# Patient Record
Sex: Male | Born: 1985 | Race: White | Hispanic: No | Marital: Single | Smoking: Never smoker
Health system: Southern US, Community
[De-identification: ages and names within clinical notes are randomized; demographics above are authoritative.]

## PROBLEM LIST (undated history)

## (undated) DIAGNOSIS — S069X9A Unspecified intracranial injury with loss of consciousness of unspecified duration, initial encounter: Secondary | ICD-10-CM

## (undated) DIAGNOSIS — F4310 Post-traumatic stress disorder, unspecified: Secondary | ICD-10-CM

## (undated) DIAGNOSIS — E079 Disorder of thyroid, unspecified: Secondary | ICD-10-CM

## (undated) DIAGNOSIS — E0590 Thyrotoxicosis, unspecified without thyrotoxic crisis or storm: Secondary | ICD-10-CM

## (undated) DIAGNOSIS — S069XAA Unspecified intracranial injury with loss of consciousness status unknown, initial encounter: Secondary | ICD-10-CM

## (undated) DIAGNOSIS — G35 Multiple sclerosis: Secondary | ICD-10-CM

## (undated) HISTORY — PX: FRACTURE SURGERY: SHX138

---
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
## 2012-08-03 ENCOUNTER — Emergency Department (HOSPITAL_BASED_OUTPATIENT_CLINIC_OR_DEPARTMENT_OTHER)

## 2012-08-03 ENCOUNTER — Emergency Department (HOSPITAL_BASED_OUTPATIENT_CLINIC_OR_DEPARTMENT_OTHER)
Admission: EM | Admit: 2012-08-03 | Discharge: 2012-08-03 | Disposition: A | Discharge status: Home / Self Care | Attending: Emergency Medicine | Admitting: Emergency Medicine

## 2012-08-03 ENCOUNTER — Encounter (HOSPITAL_BASED_OUTPATIENT_CLINIC_OR_DEPARTMENT_OTHER): Payer: Self-pay | Admitting: *Deleted

## 2012-08-03 DIAGNOSIS — S9002XA Contusion of left ankle, initial encounter: Secondary | ICD-10-CM

## 2012-08-03 DIAGNOSIS — Y939 Activity, unspecified: Secondary | ICD-10-CM | POA: Insufficient documentation

## 2012-08-03 DIAGNOSIS — W208XXA Other cause of strike by thrown, projected or falling object, initial encounter: Secondary | ICD-10-CM | POA: Insufficient documentation

## 2012-08-03 DIAGNOSIS — Y929 Unspecified place or not applicable: Secondary | ICD-10-CM | POA: Insufficient documentation

## 2012-08-03 DIAGNOSIS — S9000XA Contusion of unspecified ankle, initial encounter: Secondary | ICD-10-CM | POA: Insufficient documentation

## 2012-08-03 DIAGNOSIS — Z79899 Other long term (current) drug therapy: Secondary | ICD-10-CM | POA: Insufficient documentation

## 2012-08-03 DIAGNOSIS — E039 Hypothyroidism, unspecified: Secondary | ICD-10-CM | POA: Insufficient documentation

## 2012-08-03 HISTORY — DX: Disorder of thyroid, unspecified: E07.9

## 2012-08-03 HISTORY — DX: Thyrotoxicosis, unspecified without thyrotoxic crisis or storm: E05.90

## 2012-08-03 MED ORDER — OXYCODONE-ACETAMINOPHEN 5-325 MG PO TABS
1.0000 | ORAL_TABLET | Freq: Once | ORAL | Status: AC
  Administered 2012-08-03: 1 via ORAL
  Filled 2012-08-03 (×2): qty 1

## 2012-08-03 MED ORDER — HYDROCODONE-ACETAMINOPHEN 5-325 MG PO TABS: 2.0000 | ORAL_TABLET | ORAL | Status: DC | PRN

## 2012-08-03 NOTE — ED Notes (Signed)
NP at bedside.

## 2012-08-03 NOTE — ED Provider Notes (Signed)
History     CSN: [id number]  Arrival date & time 08/03/12  2037   First MD Initiated Contact with Patient 08/03/12 2052      Chief Complaint  Patient presents with  . Ankle Injury    (Consider location/radiation/quality/duration/timing/severity/associated sxs/prior Treatment)  HPI Shawn Mercado is a 27 y.o. male who presents to the ED with ankle pain. This is a new problem. The onset was sudden. Patient states he was helping move a couch and the other person dropped the couch on his left ankle. He denies any other injury. He has had fractures of the same ankle in the past that required surgery and placement of plate and screws. The history was provided by the patient.  Past Medical History  Diagnosis Date  . Thyroid disease   . Hyperthyroidism     Past Surgical History  Procedure Laterality Date  . Fracture surgery      History reviewed. No pertinent family history.  History  Substance Use Topics  . Smoking status: Never Smoker   . Smokeless tobacco: Not on file  . Alcohol Use: No      Review of Systems  Constitutional: Negative for activity change.  HENT: Negative for neck pain.   Cardiovascular: Negative for chest pain.  Gastrointestinal: Negative for nausea and vomiting.  Musculoskeletal:       Left ankle pain.  Skin: Negative for wound.  Neurological: Negative for dizziness.    Allergies  Review of patient's allergies indicates no known allergies.  Home Medications   Current Outpatient Rx  Name  Route  Sig  Dispense  Refill  . Levothyroxine Sodium (SYNTHROID PO)   Oral   Take by mouth.           BP 160/105  Pulse 84  Temp(Src) 97.8 F (36.6 C) (Oral)  Resp 20  Ht 5\' 10"  (1.778 m)  Wt 340 lb (154.223 kg)  BMI 48.78 kg/m2  SpO2 100%  Physical Exam  Nursing note and vitals reviewed. Constitutional: He is oriented to person, place, and time.  Obese WM with elevated BP  HENT:  Head: Normocephalic and atraumatic.  Eyes: EOM are normal.   Neck: Neck supple.  Cardiovascular: Normal rate.   Pulmonary/Chest: Effort normal.  Musculoskeletal:       Left ankle: He exhibits swelling. He exhibits normal range of motion and no deformity. Tenderness. Lateral malleolus tenderness found. Achilles tendon normal.  Neurological: He is alert and oriented to person, place, and time. He has normal strength. No cranial nerve deficit or sensory deficit.  Pedal pulse strong, good touch sensation, adequate circulation.  Skin: Skin is warm and dry.  Psychiatric: He has a normal mood and affect. His behavior is normal. Judgment and thought content normal.   Procedures Dg Ankle Complete Left  08/03/2012  *RADIOLOGY REPORT*  Clinical Data: Trauma.  Lateral pain.  Surgery 3 years ago.  LEFT ANKLE COMPLETE - 3+ VIEW  Comparison: None.  Findings: Prior surgery left fibular with side plate and screw is in place.  No acute fracture or dislocation.  IMPRESSION:  Prior surgery left fibular with side plate and screw is in place.  No acute fracture or dislocation.   Original Report Authenticated By: Lacy Duverney, M.D.     Assessment: 27 y.o. male with contusion to left ankle   Plan:  ASO, crutches, ice, elevate   Follow up with ortho, return here as needed. . Discussed with the patient and all questioned fully answered. He will return if  any problems arise.    Medication List    TAKE these medications       HYDROcodone-acetaminophen 5-325 MG per tablet  Commonly known as:  NORCO/VICODIN  Take 2 tablets by mouth every 4 (four) hours as needed for pain.      ASK your doctor about these medications       SYNTHROID PO  Take by mouth.            [facility] Orlene Och, [place] 08/04/12 0025

## 2012-08-03 NOTE — ED Notes (Signed)
Pt states he was moving a couch with someone and they dropped it on his left ankle. Now c/o pain to same.

## 2012-08-04 NOTE — ED Provider Notes (Signed)
Medical screening examination/treatment/procedure(s) were performed by non-physician practitioner and as supervising physician I was immediately available for consultation/collaboration.   Charles B. Bernette Mayers, MD 08/04/12 [phone]

## 2012-08-26 ENCOUNTER — Emergency Department (HOSPITAL_BASED_OUTPATIENT_CLINIC_OR_DEPARTMENT_OTHER)
Admission: EM | Admit: 2012-08-26 | Discharge: 2012-08-26 | Disposition: A | Discharge status: Home / Self Care | Attending: Emergency Medicine | Admitting: Emergency Medicine

## 2012-08-26 ENCOUNTER — Encounter (HOSPITAL_BASED_OUTPATIENT_CLINIC_OR_DEPARTMENT_OTHER): Payer: Self-pay | Admitting: *Deleted

## 2012-08-26 DIAGNOSIS — Z79899 Other long term (current) drug therapy: Secondary | ICD-10-CM | POA: Insufficient documentation

## 2012-08-26 DIAGNOSIS — E669 Obesity, unspecified: Secondary | ICD-10-CM | POA: Insufficient documentation

## 2012-08-26 DIAGNOSIS — E0590 Thyrotoxicosis, unspecified without thyrotoxic crisis or storm: Secondary | ICD-10-CM | POA: Insufficient documentation

## 2012-08-26 DIAGNOSIS — K648 Other hemorrhoids: Secondary | ICD-10-CM

## 2012-08-26 MED ORDER — DOCUSATE SODIUM 100 MG PO CAPS: 100.0000 mg | ORAL_CAPSULE | Freq: Two times a day (BID) | ORAL | Status: DC

## 2012-08-26 NOTE — ED Provider Notes (Signed)
History     CSN: [id number]  Arrival date & time 08/26/12  2023   First MD Initiated Contact with Patient 08/26/12 2206      Chief Complaint  Patient presents with  . Rectal Bleeding    (Consider location/radiation/quality/duration/timing/severity/associated sxs/prior treatment) HPI  Kenden Brandt is a 27 y.o. male complaining of rectal bleeding after defecation earlier in the day. Patient has been having mild rectal bleeding for over a month but the episode several hours ago was quite severe. Pt  also has Pain with defecation. Patient reports that he has a mild lingering rectal pain that is exacerbated when he sits. He denies any sensation of external hemorrhoids, or itching.  Past Medical History  Diagnosis Date  . Thyroid disease   . Hyperthyroidism     Past Surgical History  Procedure Laterality Date  . Fracture surgery      History reviewed. No pertinent family history.  History  Substance Use Topics  . Smoking status: Never Smoker   . Smokeless tobacco: Not on file  . Alcohol Use: No      Review of Systems  Constitutional: Negative for fever.  Respiratory: Negative for shortness of breath.   Cardiovascular: Negative for chest pain.  Gastrointestinal: Positive for blood in stool. Negative for nausea, vomiting, abdominal pain and diarrhea.  All other systems reviewed and are negative.    Allergies  Review of patient's allergies indicates no known allergies.  Home Medications   Current Outpatient Rx  Name  Route  Sig  Dispense  Refill  . Levothyroxine Sodium (SYNTHROID PO)   Oral   Take by mouth.         Marland Kitchen HYDROcodone-acetaminophen (NORCO/VICODIN) 5-325 MG per tablet   Oral   Take 2 tablets by mouth every 4 (four) hours as needed for pain.   10 tablet   0     BP 143/110  Pulse 84  Temp(Src) 98.4 F (36.9 C) (Oral)  Resp 20  Ht 5\' 10"  (1.778 m)  Wt 346 lb (156.945 kg)  BMI 49.65 kg/m2  SpO2 99%  Physical Exam  Nursing note and vitals  reviewed. Constitutional: He is oriented to person, place, and time. He appears well-developed and well-nourished. No distress.  Obese  HENT:  Head: Normocephalic.  Mouth/Throat: Oropharynx is clear and moist.  Eyes: Conjunctivae and EOM are normal. Pupils are equal, round, and reactive to light.  Neck: Normal range of motion.  Cardiovascular: Normal rate.   Pulmonary/Chest: Effort normal and breath sounds normal. No stridor. No respiratory distress. He has no wheezes. He has no rales. He exhibits no tenderness.  Abdominal: Soft. Bowel sounds are normal. He exhibits no distension and no mass. There is no tenderness. There is no rebound and no guarding.  Genitourinary:  No external hemorrhoids, anal fissures, stool is normal color, good rectal tone.  Musculoskeletal: Normal range of motion.  Neurological: He is alert and oriented to person, place, and time.  Skin: Skin is warm.  Psychiatric: He has a normal mood and affect.    ED Course  Procedures (including critical care time)  Labs Reviewed  OCCULT BLOOD X 1 CARD TO LAB, STOOL - Abnormal; Notable for the following:    Fecal Occult Bld POSITIVE (*)    All other components within normal limits   No results found.   1. Internal hemorrhoids       MDM   Marshal Eskew is a 27 y.o. male with uncomplicated internal hemorrhoids. I have given reassurance  and advised him to follow with his primary care provider at the [place].   Filed Vitals:   08/26/12 2040  BP: 143/110  Pulse: 84  Temp: 98.4 F (36.9 C)  TempSrc: Oral  Resp: 20  Height: 5\' 10"  (1.778 m)  Weight: 346 lb (156.945 kg)  SpO2: 99%     Pt verbalized understanding and agrees with care plan. Outpatient follow-up and return precautions given.    New Prescriptions   DOCUSATE SODIUM (COLACE) 100 MG CAPSULE    Take 1 capsule (100 mg total) by mouth every 12 (twelve) hours.           Wynetta Emery, PA-C 08/28/12 1528

## 2012-08-26 NOTE — ED Notes (Signed)
Pt. Reports more blood today with BM than he has had in the last mts.  No injury to the rectum per the Pt.

## 2012-08-26 NOTE — ED Notes (Signed)
Pt states he has had episodes of rectal bleeding for past month but sxs worsened tonight. Some pain with sitting and has been having to strain with BM.

## 2012-08-28 NOTE — ED Provider Notes (Signed)
Medical screening examination/treatment/procedure(s) were performed by non-physician practitioner and as supervising physician I was immediately available for consultation/collaboration.  Ethelda Chick, MD 08/28/12 [phone]

## 2013-01-08 ENCOUNTER — Emergency Department (HOSPITAL_BASED_OUTPATIENT_CLINIC_OR_DEPARTMENT_OTHER)
Admission: EM | Admit: 2013-01-08 | Discharge: 2013-01-09 | Disposition: A | Discharge status: Home / Self Care | Attending: Emergency Medicine | Admitting: Emergency Medicine

## 2013-01-08 ENCOUNTER — Encounter (HOSPITAL_BASED_OUTPATIENT_CLINIC_OR_DEPARTMENT_OTHER): Payer: Self-pay | Admitting: Emergency Medicine

## 2013-01-08 ENCOUNTER — Emergency Department (HOSPITAL_BASED_OUTPATIENT_CLINIC_OR_DEPARTMENT_OTHER)

## 2013-01-08 DIAGNOSIS — Z8639 Personal history of other endocrine, nutritional and metabolic disease: Secondary | ICD-10-CM | POA: Insufficient documentation

## 2013-01-08 DIAGNOSIS — R079 Chest pain, unspecified: Secondary | ICD-10-CM

## 2013-01-08 DIAGNOSIS — Z8669 Personal history of other diseases of the nervous system and sense organs: Secondary | ICD-10-CM | POA: Insufficient documentation

## 2013-01-08 DIAGNOSIS — R0789 Other chest pain: Secondary | ICD-10-CM | POA: Insufficient documentation

## 2013-01-08 DIAGNOSIS — Z8659 Personal history of other mental and behavioral disorders: Secondary | ICD-10-CM | POA: Insufficient documentation

## 2013-01-08 DIAGNOSIS — Z862 Personal history of diseases of the blood and blood-forming organs and certain disorders involving the immune mechanism: Secondary | ICD-10-CM | POA: Insufficient documentation

## 2013-01-08 DIAGNOSIS — R0602 Shortness of breath: Secondary | ICD-10-CM | POA: Insufficient documentation

## 2013-01-08 DIAGNOSIS — Z79899 Other long term (current) drug therapy: Secondary | ICD-10-CM | POA: Insufficient documentation

## 2013-01-08 DIAGNOSIS — Z87820 Personal history of traumatic brain injury: Secondary | ICD-10-CM | POA: Insufficient documentation

## 2013-01-08 HISTORY — DX: Multiple sclerosis: G35

## 2013-01-08 HISTORY — DX: Unspecified intracranial injury with loss of consciousness of unspecified duration, initial encounter: S06.9X9A

## 2013-01-08 HISTORY — DX: Unspecified intracranial injury with loss of consciousness status unknown, initial encounter: S06.9XAA

## 2013-01-08 HISTORY — DX: Morbid (severe) obesity due to excess calories: E66.01

## 2013-01-08 HISTORY — DX: Post-traumatic stress disorder, unspecified: F43.10

## 2013-01-08 LAB — CBC WITH DIFFERENTIAL/PLATELET
Eosinophils Absolute: 0.1 10*3/uL (ref 0.0–0.7)
Eosinophils Relative: 2 % (ref 0–5)
HCT: 46.3 % (ref 39.0–52.0)
Hemoglobin: 16.6 g/dL (ref 13.0–17.0)
Lymphs Abs: 2.7 10*3/uL (ref 0.7–4.0)
MCH: 30.9 pg (ref 26.0–34.0)
MCV: 86.1 fL (ref 78.0–100.0)
Monocytes Absolute: 0.5 10*3/uL (ref 0.1–1.0)
Monocytes Relative: 7 % (ref 3–12)
RBC: 5.38 MIL/uL (ref 4.22–5.81)

## 2013-01-08 LAB — COMPREHENSIVE METABOLIC PANEL
BUN: 24 mg/dL — ABNORMAL HIGH (ref 6–23)
CO2: 28 mEq/L (ref 19–32)
Calcium: 9.8 mg/dL (ref 8.4–10.5)
GFR calc Af Amer: >90 mL/min (ref >90)
GFR calc non Af Amer: >90 mL/min (ref >90)
Glucose, Bld: 106 mg/dL — ABNORMAL HIGH (ref 70–99)
Total Protein: 7.1 g/dL (ref 6.0–8.3)

## 2013-01-08 LAB — TROPONIN I: Troponin I: <0.3 ng/mL (ref <0.30)

## 2013-01-08 MED ORDER — NITROGLYCERIN 0.4 MG SL SUBL
0.4000 mg | SUBLINGUAL_TABLET | SUBLINGUAL | Status: DC | PRN
  Administered 2013-01-08: 0.4 mg via SUBLINGUAL
  Filled 2013-01-08: qty 25

## 2013-01-08 NOTE — ED Provider Notes (Signed)
CSN: [id number]     Arrival date & time 01/08/13  2026 History     First MD Initiated Contact with Patient 01/08/13 2039     Chief Complaint  Patient presents with  . Chest Pain  . Shortness of Breath   (Consider location/radiation/quality/duration/timing/severity/associated sxs/prior Treatment) Patient is a 27 y.o. male presenting with chest pain and shortness of breath. The history is provided by the patient.  Chest Pain Pain location:  L chest Pain quality: aching and tightness   Pain quality comment:  Heaviness Pain radiates to:  L arm Pain radiates to the back: no   Pain severity:  Moderate Onset quality:  Sudden Timing:  Constant Progression:  Improving Chronicity:  New Context: stress   Context: not breathing, not eating, no intercourse, not lifting, no movement, not raising an arm and no trauma   Relieved by:  Nothing Worsened by:  Nothing tried Ineffective treatments:  None tried Associated symptoms: shortness of breath (mild)   Associated symptoms: no abdominal pain, no anxiety, no back pain, no dizziness and no fever   Shortness of Breath Associated symptoms: chest pain   Associated symptoms: no abdominal pain and no fever     Past Medical History  Diagnosis Date  . Thyroid disease   . Hyperthyroidism   . Morbid obesity   . MS (multiple sclerosis)   . Post traumatic stress disorder (PTSD)   . TBI (traumatic brain injury)    Past Surgical History  Procedure Laterality Date  . Fracture surgery     No family history on file. History  Substance Use Topics  . Smoking status: Never Smoker   . Smokeless tobacco: Not on file  . Alcohol Use: No    Review of Systems  Constitutional: Negative for fever.  Respiratory: Positive for shortness of breath (mild).   Cardiovascular: Positive for chest pain.  Gastrointestinal: Negative for abdominal pain.  Musculoskeletal: Negative for back pain.  Neurological: Negative for dizziness.  All other systems reviewed  and are negative.    Allergies  Review of patient's allergies indicates no known allergies.  Home Medications   Current Outpatient Rx  Name  Route  Sig  Dispense  Refill  . docusate sodium (COLACE) 100 MG capsule   Oral   Take 1 capsule (100 mg total) by mouth every 12 (twelve) hours.   30 capsule   0   . HYDROcodone-acetaminophen (NORCO/VICODIN) 5-325 MG per tablet   Oral   Take 2 tablets by mouth every 4 (four) hours as needed for pain.   10 tablet   0   . Levothyroxine Sodium (SYNTHROID PO)   Oral   Take by mouth.          BP 131/82  Pulse 79  Temp(Src) 98.1 F (36.7 C) (Oral)  Resp 18  Ht 5\' 10"  (1.778 m)  Wt 335 lb (151.955 kg)  BMI 48.07 kg/m2  SpO2 98% Physical Exam  Nursing note and vitals reviewed. Constitutional: He is oriented to person, place, and time. He appears well-developed and well-nourished. No distress.  HENT:  Head: Normocephalic and atraumatic.  Mouth/Throat: No oropharyngeal exudate.  Eyes: EOM are normal. Pupils are equal, round, and reactive to light.  Neck: Normal range of motion. Neck supple.  Cardiovascular: Normal rate and regular rhythm.  Exam reveals no friction rub.   No murmur heard. Pulmonary/Chest: Effort normal and breath sounds normal. No respiratory distress. He has no wheezes. He has no rales.  Abdominal: He exhibits no distension.  There is no tenderness. There is no rebound.  Musculoskeletal: Normal range of motion. He exhibits no edema.  Neurological: He is alert and oriented to person, place, and time.  Skin: He is not diaphoretic.    ED Course   Procedures (including critical care time)  Labs Reviewed  CBC WITH DIFFERENTIAL  COMPREHENSIVE METABOLIC PANEL  TROPONIN I   Dg Chest 2 View  01/08/2013   *RADIOLOGY REPORT*  Clinical Data: Chest pain  CHEST - 2 VIEW  Comparison:  None.  Findings:  The heart size and mediastinal contours are within normal limits.  Both lungs are clear.  The visualized skeletal  structures are unremarkable.  IMPRESSION: No active cardiopulmonary disease.   Original Report Authenticated By: Alcide Clever, M.D.   1. Chest pain      Date: 01/08/2013  Rate: 68  Rhythm: normal sinus rhythm  QRS Axis: normal  Intervals: normal  ST/T Wave abnormalities: normal  Conduction Disutrbances:none  Narrative Interpretation:   Old EKG Reviewed: none available   MDM  27 year old male with history of multiple sclerosis presents with chest pain. Began while at church, which is walking around. Chest pain was central radiate to left arm and neck. Described as heaviness. It is improving. This is a 7/10 down from about a 3/10. Vitals are stable here. Lungs are clear. Patient is morbidly obese. Initial EKG is normal. With patient or her obesity and classic symptoms, we'll do delta troponin. If labs negative, will have patient followup with PCP for an outpatient stress test. Chest x-ray is normal and was reviewed by me.   Dagmar Hait, MD 01/09/13 [phone]

## 2013-01-08 NOTE — ED Notes (Signed)
Pt states around 2000, started feeling chest pain in the upper left portion of chest radiating to left arm, dizzy, sob, light tinging in left fingers. Patient in NAD, denies feeling short of breath currently. Pain in chest started out as 7/10, now about a 3/10. Denies N/V, HA.

## 2013-04-18 ENCOUNTER — Encounter (HOSPITAL_BASED_OUTPATIENT_CLINIC_OR_DEPARTMENT_OTHER): Payer: Self-pay | Admitting: Emergency Medicine

## 2013-04-18 ENCOUNTER — Emergency Department (HOSPITAL_BASED_OUTPATIENT_CLINIC_OR_DEPARTMENT_OTHER)
Admission: EM | Admit: 2013-04-18 | Discharge: 2013-04-18 | Disposition: A | Discharge status: Home / Self Care | Attending: Emergency Medicine | Admitting: Emergency Medicine

## 2013-04-18 DIAGNOSIS — E0590 Thyrotoxicosis, unspecified without thyrotoxic crisis or storm: Secondary | ICD-10-CM | POA: Insufficient documentation

## 2013-04-18 DIAGNOSIS — T451X5A Adverse effect of antineoplastic and immunosuppressive drugs, initial encounter: Secondary | ICD-10-CM | POA: Insufficient documentation

## 2013-04-18 DIAGNOSIS — Z8659 Personal history of other mental and behavioral disorders: Secondary | ICD-10-CM | POA: Insufficient documentation

## 2013-04-18 DIAGNOSIS — T50905A Adverse effect of unspecified drugs, medicaments and biological substances, initial encounter: Secondary | ICD-10-CM

## 2013-04-18 DIAGNOSIS — R6889 Other general symptoms and signs: Secondary | ICD-10-CM | POA: Insufficient documentation

## 2013-04-18 DIAGNOSIS — Z87820 Personal history of traumatic brain injury: Secondary | ICD-10-CM | POA: Insufficient documentation

## 2013-04-18 DIAGNOSIS — Z79899 Other long term (current) drug therapy: Secondary | ICD-10-CM | POA: Insufficient documentation

## 2013-04-18 MED ORDER — DIPHENHYDRAMINE HCL 25 MG PO CAPS
50.0000 mg | ORAL_CAPSULE | Freq: Once | ORAL | Status: AC
  Administered 2013-04-18: 50 mg via ORAL
  Filled 2013-04-18: qty 2

## 2013-04-18 NOTE — ED Notes (Signed)
Pt states he started on  Glatiramer acetate ( for MS) started 2 weeks ago taking three times a day has taken before but at a lower dosage. States 5 minutes  After injection began having scratchy throat also has cough and congestion

## 2013-04-18 NOTE — ED Provider Notes (Signed)
I saw and evaluated the patient, reviewed the resident's note and I agree with the findings and plan. Patient is a 27 year old male with history of multiple sclerosis treated with Copaxone. He gave himself an injection of this this morning and shortly afterward began to feel itchy and his throat felt scratchy. He also had some redness around the injection site along with some swelling. He came in by 911 for evaluation of possible allergic reaction. He is feeling better now that he has arrived to the emergency department without any intervention being given by EMS.  On exam vitals are stable and the patient is afebrile. Heart is regular rate and rhythm and lungs are clear. Posterior oropharynx is clear without swelling or edema. There is no stridor. The injections site on the left hip is noted to have slight erythema and induration however does not appear infected.  Patient presents with what appears to be more of a side effect to his medication than a true allergic reaction. As this medication may be beneficial for his chronic illness, I am hesitant to label him allergic to it. I've advised him to followup with his neurologist before giving himself another one of these injections to discuss whether he wants him to continue this or not. He is feeling better and vitals remained stable. There is no hypoxia or respiratory distress. I feel as though he is stable for discharge.    Geoffery Lyons, MD 04/18/13 1014

## 2013-04-18 NOTE — ED Provider Notes (Signed)
CSN: [id number]     Arrival date & time 04/18/13  0810 History   First MD Initiated Contact with Patient 04/18/13 [phone]     Chief Complaint  Patient presents with  . throat feels scratchy    (Consider location/radiation/quality/duration/timing/severity/associated sxs/prior Treatment) HPI  27 year old male here with concern for allergic reaction to Copoxone immediately after injection this morning. He states that immediately after the injection he began to have scratchy throat, tightening throat, swollen tongue, and felt like he is having a hard time breathing. He did not immediately want to come to the hospital was remake called his father who recommended calling the ambulance. He needs to have a mild "scratchy throat " feeling at this time. He's taking this medicine for MS. He was previously on this medication for years, he is doing well so was stopped, he had one relapse and he was restarted on a dose that is twice his previous dose.. This was his third dose, as it's a 3 times per week medicine. He states that since starting this larger dose he's had redness and swelling of the injection site each time but has not had this reaction.   Past Medical History  Diagnosis Date  . Thyroid disease   . Hyperthyroidism   . Morbid obesity   . MS (multiple sclerosis)   . Post traumatic stress disorder (PTSD)   . TBI (traumatic brain injury)    Past Surgical History  Procedure Laterality Date  . Fracture surgery     History reviewed. No pertinent family history. History  Substance Use Topics  . Smoking status: Never Smoker   . Smokeless tobacco: Not on file  . Alcohol Use: No    Review of Systems  Constitutional: Negative for fever, chills and diaphoresis.  HENT: Negative for facial swelling and sore throat.        Scratching tightening throat per HPI  Eyes: Negative for visual disturbance.  Respiratory: Negative for cough and shortness of breath.   Gastrointestinal: Negative for nausea,  vomiting, abdominal pain and diarrhea.  Genitourinary: Negative for dysuria.  Musculoskeletal: Negative for back pain.  Skin: Negative for rash.  Neurological: Negative for headaches.    Allergies  Review of patient's allergies indicates no known allergies.  Home Medications   Current Outpatient Rx  Name  Route  Sig  Dispense  Refill  . glatiramer (COPAXONE) 20 MG/ML injection   Subcutaneous   Inject 20 mg into the skin daily.         . Oxcarbazepine (TRILEPTAL) 300 MG tablet   Oral   Take 300 mg by mouth 2 (two) times daily.         Marland Kitchen docusate sodium (COLACE) 100 MG capsule   Oral   Take 1 capsule (100 mg total) by mouth every 12 (twelve) hours.   30 capsule   0   . HYDROcodone-acetaminophen (NORCO/VICODIN) 5-325 MG per tablet   Oral   Take 2 tablets by mouth every 4 (four) hours as needed for pain.   10 tablet   0   . Levothyroxine Sodium (SYNTHROID PO)   Oral   Take by mouth.          BP 137/91  Pulse 68  Temp(Src) 97.9 F (36.6 C) (Oral)  Resp 18  Ht 5\' 10"  (1.778 m)  Wt 330 lb (149.687 kg)  BMI 47.35 kg/m2  SpO2 100% Physical Exam  Constitutional: He is oriented to person, place, and time. He appears well-developed and well-nourished. No distress.  HENT:  Head: Normocephalic and atraumatic.  Mouth/Throat: No oropharyngeal exudate, posterior oropharyngeal edema or posterior oropharyngeal erythema.  Eyes: EOM are normal. Pupils are equal, round, and reactive to light.  Neck: Normal range of motion. Neck supple.  Cardiovascular: Normal rate, regular rhythm and normal heart sounds.   Pulmonary/Chest: Effort normal and breath sounds normal. No respiratory distress. He has no wheezes.  Abdominal: Soft. Bowel sounds are normal. There is no tenderness.  Musculoskeletal: He exhibits no edema.  Neurological: He is alert and oriented to person, place, and time.  Skin: Skin is warm and dry. He is not diaphoretic.  Psychiatric: He has a normal mood and  affect.    ED Course  Procedures (including critical care time) Labs Review Labs Reviewed - No data to display Imaging Review No results found.  EKG Interpretation   None       MDM  No diagnosis found.  27 year old male here with concern for allergic reaction after taking medicine this morning. On exam he is in no respiratory distress and does not have any stigmata of allergic reaction. Its difficult from his story and exam if this is an allergic reaction or merely a side effect.   Improved with PO benadryl, stable for DC PRN benadryl Discussed stopping med until he can see or discuss it with his neurologist.  Explained that this may not be a true allergy, buut to treat it that way until he can duiscuss it, given red flags for anaphylaxis.   Murtis Sink, MD [facility] Resident, PGY-2 04/18/2013, 10:07 AM     Elenora Gamma, MD 04/18/13 1007

## 2014-08-11 ENCOUNTER — Other Ambulatory Visit: Payer: Self-pay | Admitting: Neurology

## 2014-08-11 DIAGNOSIS — R569 Unspecified convulsions: Secondary | ICD-10-CM

## 2014-08-29 ENCOUNTER — Other Ambulatory Visit

## 2014-08-29 ENCOUNTER — Ambulatory Visit
Admission: RE | Admit: 2014-08-29 | Discharge: 2014-08-29 | Disposition: A | Discharge status: Home / Self Care | Payer: Non-veteran care | Source: Ambulatory Visit | Attending: Neurology | Admitting: Neurology

## 2014-08-29 DIAGNOSIS — R569 Unspecified convulsions: Secondary | ICD-10-CM

## 2015-04-16 ENCOUNTER — Other Ambulatory Visit: Payer: Self-pay | Admitting: Neurology

## 2015-04-16 DIAGNOSIS — G35 Multiple sclerosis: Secondary | ICD-10-CM

## 2015-04-26 ENCOUNTER — Ambulatory Visit (INDEPENDENT_AMBULATORY_CARE_PROVIDER_SITE_OTHER): Payer: Non-veteran care

## 2015-04-26 DIAGNOSIS — G35 Multiple sclerosis: Secondary | ICD-10-CM | POA: Diagnosis not present

## 2015-04-26 MED ORDER — GADOBENATE DIMEGLUMINE 529 MG/ML IV SOLN
20.0000 mL | Freq: Once | INTRAVENOUS | Status: AC | PRN
  Administered 2015-04-26: 20 mL via INTRAVENOUS

## 2016-06-17 ENCOUNTER — Emergency Department (HOSPITAL_BASED_OUTPATIENT_CLINIC_OR_DEPARTMENT_OTHER)
Admission: EM | Admit: 2016-06-17 | Discharge: 2016-06-17 | Disposition: A | Discharge status: Home / Self Care | Payer: Non-veteran care | Attending: Emergency Medicine | Admitting: Emergency Medicine

## 2016-06-17 ENCOUNTER — Emergency Department (HOSPITAL_BASED_OUTPATIENT_CLINIC_OR_DEPARTMENT_OTHER): Payer: Non-veteran care

## 2016-06-17 ENCOUNTER — Encounter (HOSPITAL_BASED_OUTPATIENT_CLINIC_OR_DEPARTMENT_OTHER): Payer: Self-pay | Admitting: Emergency Medicine

## 2016-06-17 DIAGNOSIS — R05 Cough: Secondary | ICD-10-CM | POA: Diagnosis not present

## 2016-06-17 DIAGNOSIS — R0602 Shortness of breath: Secondary | ICD-10-CM | POA: Insufficient documentation

## 2016-06-17 DIAGNOSIS — J3489 Other specified disorders of nose and nasal sinuses: Secondary | ICD-10-CM | POA: Insufficient documentation

## 2016-06-17 DIAGNOSIS — J029 Acute pharyngitis, unspecified: Secondary | ICD-10-CM | POA: Diagnosis not present

## 2016-06-17 DIAGNOSIS — Z79899 Other long term (current) drug therapy: Secondary | ICD-10-CM | POA: Diagnosis not present

## 2016-06-17 DIAGNOSIS — R6889 Other general symptoms and signs: Secondary | ICD-10-CM

## 2016-06-17 DIAGNOSIS — R067 Sneezing: Secondary | ICD-10-CM | POA: Insufficient documentation

## 2016-06-17 DIAGNOSIS — R509 Fever, unspecified: Secondary | ICD-10-CM | POA: Insufficient documentation

## 2016-06-17 MED ORDER — BENZONATATE 100 MG PO CAPS: 100.0000 mg | ORAL_CAPSULE | Freq: Three times a day (TID) | ORAL | 0 refills | Status: DC

## 2016-06-17 NOTE — ED Provider Notes (Signed)
[facility] Provider [facility]   CSN: [id number] Arrival date & time: 06/17/16  1151     History   Chief Complaint Chief Complaint  Patient presents with  . Influenza    HPI Shawn Mercado is a 31 y.o. male.  Patient with past medical history remarkable for MS, TBI, and hyperthyroid with a chief complaint of cough and flu-like illness.  He states that he has had cough and body aches for the past week. He was seen by his doctor about a week ago and was prescribed azithromycin and given the flu shot at the [place]. He states that he works in a [employer], [employer] and complains of having had subjective fevers and chills. He denies any nausea, vomiting, or diarrhea. He states that he did feel lightheaded this morning when he stood up. There are no other associated symptoms.     The history is provided by the patient. No language interpreter was used.    Past Medical History:  Diagnosis Date  . Hyperthyroidism   . Morbid obesity (HCC)   . MS (multiple sclerosis) (HCC)   . Post traumatic stress disorder (PTSD)   . TBI (traumatic brain injury) (HCC)   . Thyroid disease     There are no active problems to display for this patient.   Past Surgical History:  Procedure Laterality Date  . FRACTURE SURGERY         Home Medications    Prior to Admission medications   Medication Sig Start Date End Date Taking? Authorizing Provider  Levothyroxine Sodium (SYNTHROID PO) Take by mouth.   Yes Historical Provider, MD  Oxcarbazepine (TRILEPTAL) 300 MG tablet Take 300 mg by mouth 2 (two) times daily.   Yes Historical Provider, MD  docusate sodium (COLACE) 100 MG capsule Take 1 capsule (100 mg total) by mouth every 12 (twelve) hours. 08/26/12   Nicole Pisciotta, PA-C  glatiramer (COPAXONE) 20 MG/ML injection Inject 20 mg into the skin daily.    Historical Provider, MD  HYDROcodone-acetaminophen (NORCO/VICODIN) 5-325 MG per tablet Take 2 tablets by mouth every 4 (four) hours as needed for  pain. 08/03/12   Hope Orlene Och, NP    Family History No family history on file.  Social History Social History  Substance Use Topics  . Smoking status: Never Smoker  . Smokeless tobacco: Never Used  . Alcohol use No     Allergies   Copaxone [glatiramer acetate]   Review of Systems Review of Systems  Constitutional: Positive for chills and fever.  HENT: Positive for postnasal drip, rhinorrhea, sinus pressure, sneezing and sore throat.   Respiratory: Positive for cough and shortness of breath.   Cardiovascular: Negative for chest pain.  Gastrointestinal: Negative for abdominal pain, constipation, diarrhea, nausea and vomiting.  Genitourinary: Negative for dysuria.  All other systems reviewed and are negative.    Physical Exam Updated Vital Signs BP 139/100 (BP Location: Right Arm)   Pulse 75   Temp 98.3 F (36.8 C) (Oral)   Resp 20   Ht 5\' 10"  (1.778 m)   Wt 131.5 kg   SpO2 100%   BMI 41.61 kg/m   Physical Exam Physical Exam  Constitutional: Pt  is oriented to person, place, and time. Appears well-developed and well-nourished. No distress.  HENT:  Head: Normocephalic and atraumatic.  Right Ear: Tympanic membrane, external ear and ear canal normal.  Left Ear: Tympanic membrane, external ear and ear canal normal.  Nose: Mucosal edema and moderate rhinorrhea present. No epistaxis. Right  sinus exhibits no maxillary sinus tenderness and no frontal sinus tenderness. Left sinus exhibits no maxillary sinus tenderness and no frontal sinus tenderness.  Mouth/Throat: Uvula is midline and mucous membranes are normal. Mucous membranes are not pale and not cyanotic. No oropharyngeal exudate, posterior oropharyngeal edema, posterior oropharyngeal erythema or tonsillar abscesses.  Eyes: Conjunctivae are normal. Pupils are equal, round, and reactive to light.  Neck: Normal range of motion and full passive range of motion without pain.  Cardiovascular: Normal rate and intact distal  pulses.   Pulmonary/Chest: Effort normal and breath sounds normal. No stridor.  Clear and equal breath sounds without focal wheezes, rhonchi, rales  Abdominal: Soft. Bowel sounds are normal. There is no tenderness.  Musculoskeletal: Normal range of motion.  Lymphadenopathy:    Pthas no cervical adenopathy.  Neurological: Pt is alert and oriented to person, place, and time.  Skin: Skin is warm and dry. No rash noted. Pt is not diaphoretic.  Psychiatric: Normal mood and affect.  Nursing note and vitals reviewed.    ED Treatments / Results  Labs (all labs ordered are listed, but only abnormal results are displayed) Labs Reviewed - No data to display  EKG  EKG Interpretation None       Radiology No results found.  Procedures Procedures (including critical care time)  Medications Ordered in ED Medications - No data to display   Initial Impression / Assessment and Plan / ED Course  I have reviewed the triage vital signs and the nursing notes.  Pertinent labs & imaging results that were available during my care of the patient were reviewed by me and considered in my medical decision making (see chart for details).  Clinical Course     Pt CXR negative for acute infiltrate. Patients symptoms are consistent with URI, likely viral etiology or flu.  Symptoms have been persistent for the past week.  Tamiflu not indicated.  Patient has MS, but has never been hospitalized and isn't immunosuppressed.  Pt will be discharged with symptomatic treatment.  Verbalizes understanding and is agreeable with plan. Pt is hemodynamically stable & in NAD prior to dc. Patient discussed with Dr. Silverio Lay.  Final Clinical Impressions(s) / ED Diagnoses   Final diagnoses:  Flu-like symptoms    New Prescriptions New Prescriptions   BENZONATATE (TESSALON) 100 MG CAPSULE    Take 1 capsule (100 mg total) by mouth every 8 (eight) hours.     Roxy Horseman, PA-C 06/17/16 1323    Charlynne Pander,  MD 06/17/16 [phone]

## 2016-06-17 NOTE — ED Triage Notes (Addendum)
For the last week has been having chills, cough, fever and sore throat. Has been taking a Z-pak x 3 days ago

## 2020-06-02 ENCOUNTER — Encounter (HOSPITAL_BASED_OUTPATIENT_CLINIC_OR_DEPARTMENT_OTHER): Payer: Self-pay | Admitting: *Deleted

## 2020-06-02 ENCOUNTER — Other Ambulatory Visit: Payer: Self-pay

## 2020-06-02 ENCOUNTER — Emergency Department (HOSPITAL_BASED_OUTPATIENT_CLINIC_OR_DEPARTMENT_OTHER): Payer: No Typology Code available for payment source

## 2020-06-02 ENCOUNTER — Emergency Department (HOSPITAL_BASED_OUTPATIENT_CLINIC_OR_DEPARTMENT_OTHER)
Admission: EM | Admit: 2020-06-02 | Discharge: 2020-06-02 | Disposition: A | Discharge status: Home / Self Care | Payer: No Typology Code available for payment source | Attending: Emergency Medicine | Admitting: Emergency Medicine

## 2020-06-02 DIAGNOSIS — R319 Hematuria, unspecified: Secondary | ICD-10-CM | POA: Diagnosis present

## 2020-06-02 DIAGNOSIS — N12 Tubulo-interstitial nephritis, not specified as acute or chronic: Secondary | ICD-10-CM | POA: Diagnosis not present

## 2020-06-02 DIAGNOSIS — R000 Tachycardia, unspecified: Secondary | ICD-10-CM | POA: Insufficient documentation

## 2020-06-02 LAB — CBC WITH DIFFERENTIAL/PLATELET
Abs Immature Granulocytes: 0.2 10*3/uL — ABNORMAL HIGH (ref 0.00–0.07)
Basophils Absolute: 0.1 10*3/uL (ref 0.0–0.1)
Basophils Relative: 0 %
Eosinophils Absolute: 0 10*3/uL (ref 0.0–0.5)
Eosinophils Relative: 0 %
HCT: 43.4 % (ref 39.0–52.0)
Hemoglobin: 15.2 g/dL (ref 13.0–17.0)
Immature Granulocytes: 1 %
Lymphocytes Relative: 14 %
Lymphs Abs: 2.4 10*3/uL (ref 0.7–4.0)
MCH: 30.8 pg (ref 26.0–34.0)
MCHC: 35 g/dL (ref 30.0–36.0)
MCV: 87.9 fL (ref 80.0–100.0)
Monocytes Absolute: 1 10*3/uL (ref 0.1–1.0)
Monocytes Relative: 6 %
Neutro Abs: 13.9 10*3/uL — ABNORMAL HIGH (ref 1.7–7.7)
Neutrophils Relative %: 79 %
Platelets: 182 10*3/uL (ref 150–400)
RBC: 4.94 MIL/uL (ref 4.22–5.81)
RDW: 14.6 % (ref 11.5–15.5)
WBC: 17.5 10*3/uL — ABNORMAL HIGH (ref 4.0–10.5)
nRBC: 0.2 % (ref 0.0–0.2)

## 2020-06-02 LAB — URINALYSIS, ROUTINE W REFLEX MICROSCOPIC

## 2020-06-02 LAB — BASIC METABOLIC PANEL
Anion gap: 14 (ref 5–15)
BUN: 14 mg/dL (ref 6–20)
CO2: 24 mmol/L (ref 22–32)
Calcium: 9.3 mg/dL (ref 8.9–10.3)
Chloride: 96 mmol/L — ABNORMAL LOW (ref 98–111)
Creatinine, Ser: 0.9 mg/dL (ref 0.61–1.24)
GFR, Estimated: >60 mL/min (ref >60)
Glucose, Bld: 99 mg/dL (ref 70–99)
Potassium: 3.6 mmol/L (ref 3.5–5.1)
Sodium: 134 mmol/L — ABNORMAL LOW (ref 135–145)

## 2020-06-02 LAB — URINALYSIS, MICROSCOPIC (REFLEX)
RBC / HPF: >50 RBC/hpf (ref 0–5)
WBC, UA: >50 WBC/hpf (ref 0–5)

## 2020-06-02 LAB — LACTIC ACID, PLASMA: Lactic Acid, Venous: 1.3 mmol/L (ref 0.5–1.9)

## 2020-06-02 MED ORDER — SODIUM CHLORIDE 0.9 % IV SOLN
1.0000 g | Freq: Once | INTRAVENOUS | Status: AC
  Administered 2020-06-02: 19:00:00 1 g via INTRAVENOUS
  Filled 2020-06-02: qty 10

## 2020-06-02 MED ORDER — CEPHALEXIN 500 MG PO CAPS: 500.0000 mg | ORAL_CAPSULE | Freq: Four times a day (QID) | ORAL | 0 refills | Status: AC

## 2020-06-02 MED ORDER — SODIUM CHLORIDE 0.9 % IV BOLUS
1000.0000 mL | Freq: Once | INTRAVENOUS | Status: AC
  Administered 2020-06-02: 19:00:00 1000 mL via INTRAVENOUS

## 2020-06-02 MED ORDER — ACETAMINOPHEN 325 MG PO TABS
ORAL_TABLET | ORAL | Status: AC
  Filled 2020-06-02: qty 2

## 2020-06-02 NOTE — ED Notes (Signed)
Pt given cup of water for po challenge

## 2020-06-02 NOTE — ED Provider Notes (Signed)
[facility] EMERGENCY DEPARTMENT Provider Note   CSN: [id number] Arrival date & time: 06/02/20  1318     History Chief Complaint  Patient presents with  . Hematuria    Shawn Mercado is a 34 y.o. male.  HPI   34 year old male with history of hypothyroidism, morbid obesity, MS, PTSD, TBI, thyroid disease, who presents to the emergency department today for evaluation of hematuria.  Patient started having gross hematuria today.  Last night he said he had urinary frequency and dysuria.  He has some bilateral mid back pain.  He said no nausea or vomiting.  He has had chills.  Denies diarrhea.  has never had similar symptoms before.  Past Medical History:  Diagnosis Date  . Hyperthyroidism   . Morbid obesity (HCC)   . MS (multiple sclerosis) (HCC)   . Post traumatic stress disorder (PTSD)   . TBI (traumatic brain injury) (HCC)   . Thyroid disease     There are no problems to display for this patient.   Past Surgical History:  Procedure Laterality Date  . FRACTURE SURGERY         No family history on file.  Social History   Tobacco Use  . Smoking status: Never Smoker  . Smokeless tobacco: Never Used  Substance Use Topics  . Alcohol use: No  . Drug use: No    Home Medications Prior to Admission medications   Medication Sig Start Date End Date Taking? Authorizing Provider  cephALEXin (KEFLEX) 500 MG capsule Take 1 capsule (500 mg total) by mouth 4 (four) times daily for 14 days. 06/02/20 06/16/20 Yes Izzabelle Bouley S, PA-C  benzonatate (TESSALON) 100 MG capsule Take 1 capsule (100 mg total) by mouth every 8 (eight) hours. 06/17/16   Roxy Horseman, PA-C  docusate sodium (COLACE) 100 MG capsule Take 1 capsule (100 mg total) by mouth every 12 (twelve) hours. 08/26/12   Pisciotta, Joni Reining, PA-C  glatiramer (COPAXONE) 20 MG/ML injection Inject 20 mg into the skin daily.    [provider]  HYDROcodone-acetaminophen (NORCO/VICODIN) 5-325 MG per tablet  Take 2 tablets by mouth every 4 (four) hours as needed for pain. 08/03/12   Janne Napoleon, NP  Levothyroxine Sodium (SYNTHROID PO) Take by mouth.    [provider]  Oxcarbazepine (TRILEPTAL) 300 MG tablet Take 300 mg by mouth 2 (two) times daily.    [provider]  sertraline (ZOLOFT) 100 MG tablet Take by mouth. 04/07/20   [provider]  XENICAL 120 MG capsule Take 120 mg by mouth at bedtime. 04/10/20   [provider]    Allergies    Copaxone [glatiramer acetate]  Review of Systems   Review of Systems  Constitutional: Positive for chills. Negative for fever.  HENT: Negative for ear pain and sore throat.   Eyes: Negative for pain and visual disturbance.  Respiratory: Negative for cough and shortness of breath.   Cardiovascular: Negative for chest pain.  Gastrointestinal: Negative for abdominal pain, constipation, diarrhea, nausea and vomiting.  Genitourinary: Positive for dysuria, flank pain, frequency and hematuria.  Musculoskeletal: Negative for back pain.  Skin: Negative for rash.  Neurological: Negative for seizures and syncope.  All other systems reviewed and are negative.   Physical Exam Updated Vital Signs BP 128/74 (BP Location: Left Arm)   Pulse 85   Temp 99.1 F (37.3 C) (Oral)   Resp (!) 25   SpO2 96%   Physical Exam Vitals and nursing note reviewed.  Constitutional:  Appearance: He is well-developed and well-nourished.  HENT:     Head: Normocephalic and atraumatic.  Eyes:     Conjunctiva/sclera: Conjunctivae normal.  Cardiovascular:     Rate and Rhythm: Regular rhythm. Tachycardia present.     Heart sounds: No murmur heard.   Pulmonary:     Effort: Pulmonary effort is normal. No respiratory distress.     Breath sounds: Normal breath sounds. No wheezing, rhonchi or rales.  Abdominal:     General: Bowel sounds are normal.     Palpations: Abdomen is soft.     Tenderness: There is no abdominal tenderness. There is  right CVA tenderness. There is no left CVA tenderness, guarding or rebound.  Musculoskeletal:        General: No edema.     Cervical back: Neck supple.  Skin:    General: Skin is warm and dry.  Neurological:     Mental Status: He is alert.  Psychiatric:        Mood and Affect: Mood and affect normal.     ED Results / Procedures / Treatments   Labs (all labs ordered are listed, but only abnormal results are displayed) Labs Reviewed  URINALYSIS, ROUTINE W REFLEX MICROSCOPIC - Abnormal; Notable for the following components:      Result Value   Color, Urine RED (*)    APPearance TURBID (*)    Glucose, UA   (*)    Value: TEST NOT REPORTED DUE TO COLOR INTERFERENCE OF URINE PIGMENT   Hgb urine dipstick   (*)    Value: TEST NOT REPORTED DUE TO COLOR INTERFERENCE OF URINE PIGMENT   Bilirubin Urine   (*)    Value: TEST NOT REPORTED DUE TO COLOR INTERFERENCE OF URINE PIGMENT   Ketones, ur   (*)    Value: TEST NOT REPORTED DUE TO COLOR INTERFERENCE OF URINE PIGMENT   Protein, ur   (*)    Value: TEST NOT REPORTED DUE TO COLOR INTERFERENCE OF URINE PIGMENT   Nitrite   (*)    Value: TEST NOT REPORTED DUE TO COLOR INTERFERENCE OF URINE PIGMENT   Leukocytes,Ua   (*)    Value: TEST NOT REPORTED DUE TO COLOR INTERFERENCE OF URINE PIGMENT   All other components within normal limits  URINALYSIS, MICROSCOPIC (REFLEX) - Abnormal; Notable for the following components:   Bacteria, UA MANY (*)    All other components within normal limits  CBC WITH DIFFERENTIAL/PLATELET - Abnormal; Notable for the following components:   WBC 17.5 (*)    Neutro Abs 13.9 (*)    Abs Immature Granulocytes 0.20 (*)    All other components within normal limits  BASIC METABOLIC PANEL - Abnormal; Notable for the following components:   Sodium 134 (*)    Chloride 96 (*)    All other components within normal limits  CULTURE, BLOOD (ROUTINE X 2)  CULTURE, BLOOD (ROUTINE X 2)  URINE CULTURE  LACTIC ACID, PLASMA  LACTIC  ACID, PLASMA    EKG EKG Interpretation  Date/Time:  Wednesday June 02 2020 18:15:58 EST Ventricular Rate:  84 PR Interval:    QRS Duration: 93 QT Interval:  332 QTC Calculation: 393 R Axis:   4 Text Interpretation: Sinus rhythm No significant change was found Confirmed by Glynn Octave [phone]) on 06/02/2020 6:19:40 PM   Radiology CT Renal Stone Study  Result Date: 06/02/2020 CLINICAL DATA:  Hematuria. EXAM: CT ABDOMEN AND PELVIS WITHOUT CONTRAST TECHNIQUE: Multidetector CT imaging of the abdomen and pelvis  was performed following the standard protocol without IV contrast. COMPARISON:  None. FINDINGS: Lower chest: No acute abnormality. Hepatobiliary: No focal liver abnormality is seen. No gallstones, gallbladder wall thickening, or biliary dilatation. Pancreas: Unremarkable. No pancreatic ductal dilatation or surrounding inflammatory changes. Spleen: Normal in size without focal abnormality. Adrenals/Urinary Tract: Adrenal glands are unremarkable. Kidneys are normal, without renal calculi, focal lesion, or hydronephrosis. Bladder is unremarkable. Stomach/Bowel: Stomach is within normal limits. Appendix appears normal. No evidence of bowel wall thickening, distention, or inflammatory changes. Vascular/Lymphatic: No significant vascular findings are present. No enlarged abdominal or pelvic lymph nodes. Reproductive: Prostate is unremarkable. Other: No abdominal wall hernia or abnormality. No abdominopelvic ascites. Musculoskeletal: No acute or significant osseous findings. IMPRESSION: No abnormality seen in the abdomen or pelvis. Electronically Signed   By: Lupita Raider M.D.   On: 06/02/2020 19:10    Procedures Procedures (including critical care time)  Medications Ordered in ED Medications  acetaminophen (TYLENOL) 325 MG tablet (  Given 06/02/20 1757)  sodium chloride 0.9 % bolus 1,000 mL ( Intravenous Stopped 06/02/20 1951)  cefTRIAXone (ROCEPHIN) 1 g in sodium chloride 0.9 %  100 mL IVPB (0 g Intravenous Stopped 06/02/20 1916)    ED Course  I have reviewed the triage vital signs and the nursing notes.  Pertinent labs & imaging results that were available during my care of the patient were reviewed by me and considered in my medical decision making (see chart for details).    MDM Rules/Calculators/A&P                          34 year old male presenting the emergency department today for evaluation of hematuria, dysuria and urinary frequency.  On arrival he is found to be febrile, tachycardic.  He has a normal blood pressure.  Reviewed/interpreted lab CBC showed a leukocytosis, no anemia. BMP showed some mild hyponatremia and hypochloremia otherwise normal renal function Lactic acid was Blood cultures were obtained UA is unable to be interpreted but microscopic UA shows greater than 50 RBCs, greater than 50 WBCs and many bacteria consistent with UTI.  Urine culture was sent.  CT renal scan was obtained which did not show any evidence of stone or other acute intra-abdominal pathology.  Patient was given IV fluids, Tylenol, ceftriaxone in the ED.  On reassessment he states he feels much improved.  Given he is immunosuppressed and has findings concerning for pyelonephritis, I recommended that the patient be admitted for IV antibiotics and further management of his infection however he declined and would prefer to be discharged with antibiotics and have close follow-up with his PCP.  His caretaker at bedside is also in agreement with this plan.  He is able to tolerate p.o. here in the emergency department.  I will give him Rx for Zofran and antibiotics for home.  Have advised on prompt follow-up and strict return cautions.  He voices understanding the plan reasons to return.  All Questions answered.  Patient stable for discharge.  Patient was seen in conjunction with supervising physician, Dr. Manus Gunning who personally evaluated the patient and is in agreement with the  plan.   Final Clinical Impression(s) / ED Diagnoses Final diagnoses:  Pyelonephritis    Rx / DC Orders ED Discharge Orders         Ordered    cephALEXin (KEFLEX) 500 MG capsule  4 times daily        06/02/20 2037  Rayne Du 06/02/20 2037    Glynn Octave, MD 06/02/20 2156

## 2020-06-02 NOTE — Discharge Instructions (Signed)
You were given a prescription for antibiotics. Please take the antibiotic prescription fully.   You were given a prescription for zofran to help with your nausea. Please take as directed.  Please follow up with your primary care provider within 5-7 days for re-evaluation of your symptoms. If you do not have a primary care provider, information for a healthcare clinic has been provided for you to make arrangements for follow up care. Please return to the emergency department for any new or worsening symptoms.  

## 2020-06-02 NOTE — ED Triage Notes (Signed)
C/o hematuria  X 1 day and penis pain

## 2020-06-07 LAB — CULTURE, BLOOD (ROUTINE X 2)
Culture: NO GROWTH
Culture: NO GROWTH
Special Requests: ADEQUATE
Special Requests: ADEQUATE

## 2020-06-08 LAB — URINE CULTURE: Culture: >=100000 — AB

## 2022-06-19 IMAGING — CT CT RENAL STONE PROTOCOL
2 of 4 series · 17 of 46 positions shown, 19 images · non-contrast
Comparison: None.

CLINICAL DATA: Hematuria.

EXAM:
CT ABDOMEN AND PELVIS WITHOUT CONTRAST
TECHNIQUE: Multidetector CT imaging of the abdomen and pelvis was performed
following the standard protocol without IV contrast.

[Series 2: axial st · axial · 0.89mm/px · z∈[+611,+1086]mm · 14 of 105 slices shown, 16 images]
[im 5/105  soft-tissue]
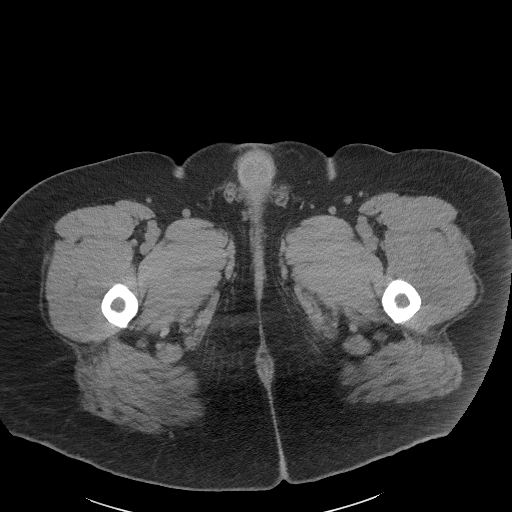
[im 5/105  bone]
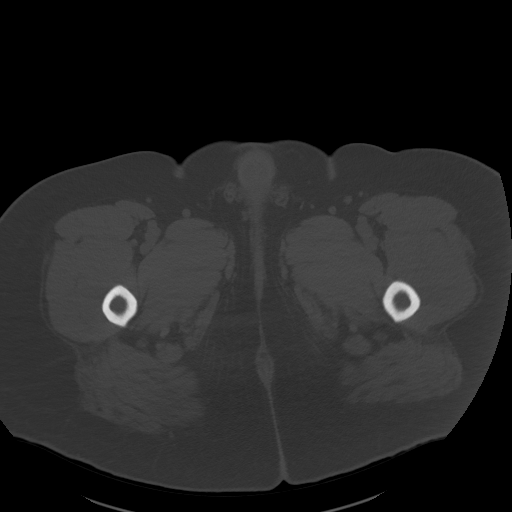
[im 13/105  soft-tissue]
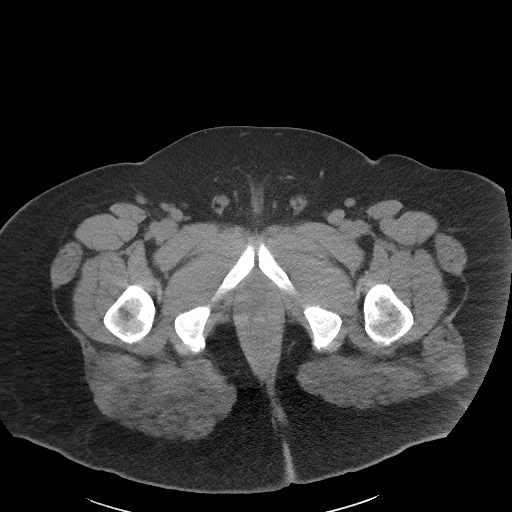
[im 21/105  soft-tissue]
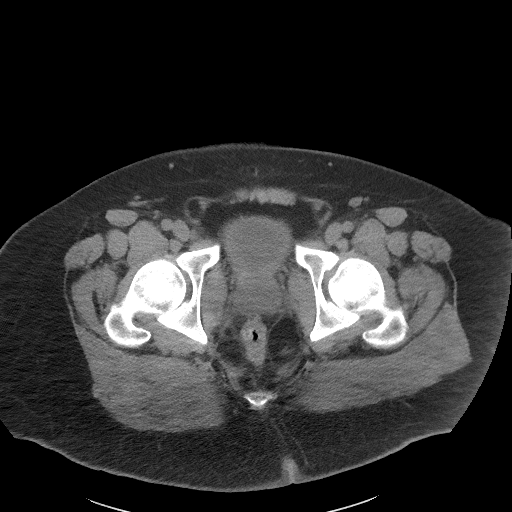
[im 30/105  soft-tissue]
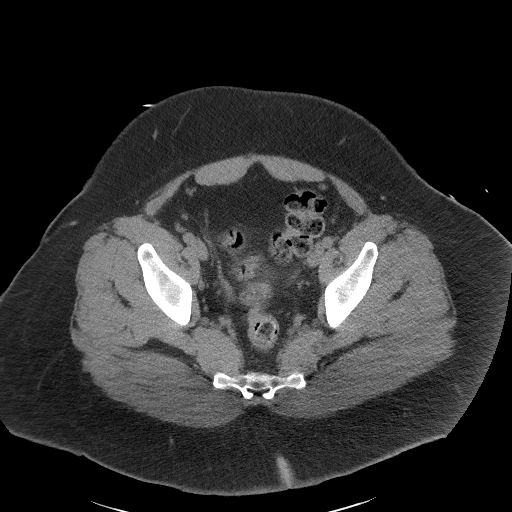
[im 34/105  soft-tissue]
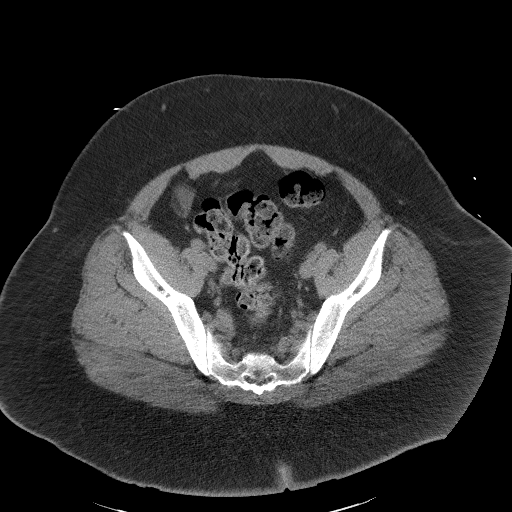
[im 42/105  soft-tissue]
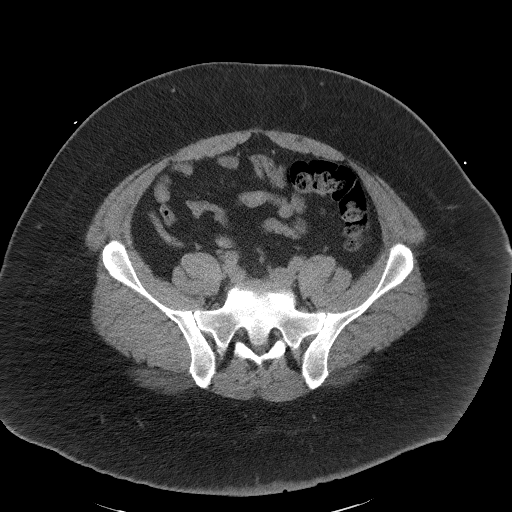
[im 50/105  soft-tissue]
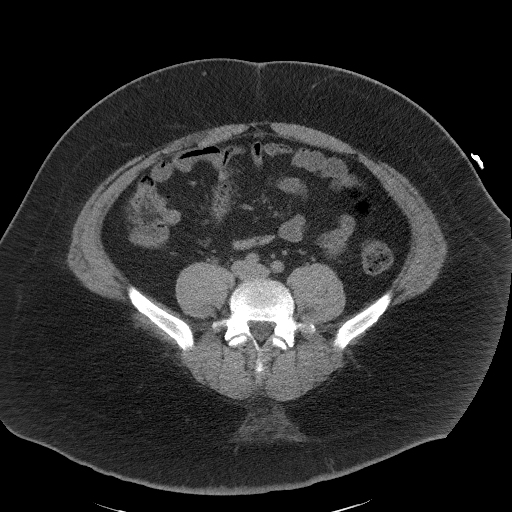
[im 55/105  soft-tissue]
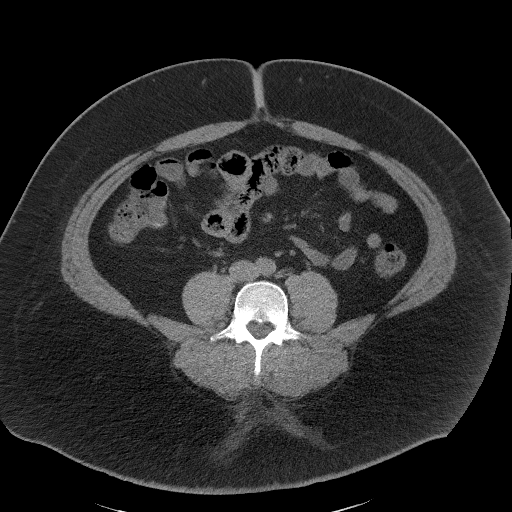
[im 63/105  soft-tissue]
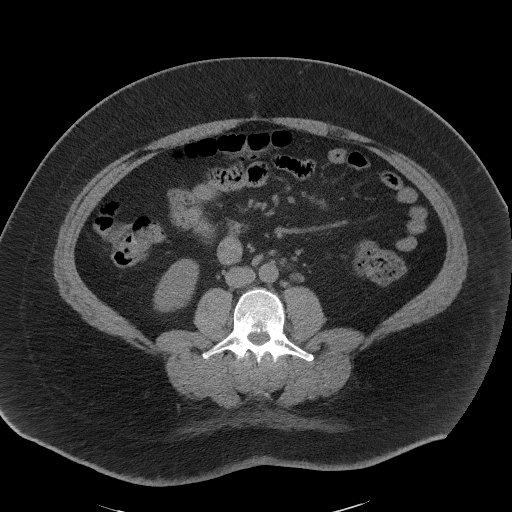
[im 63/105  bone]
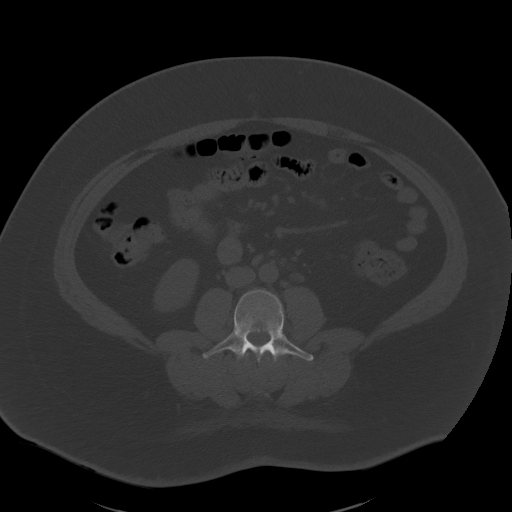
[im 71/105  soft-tissue]
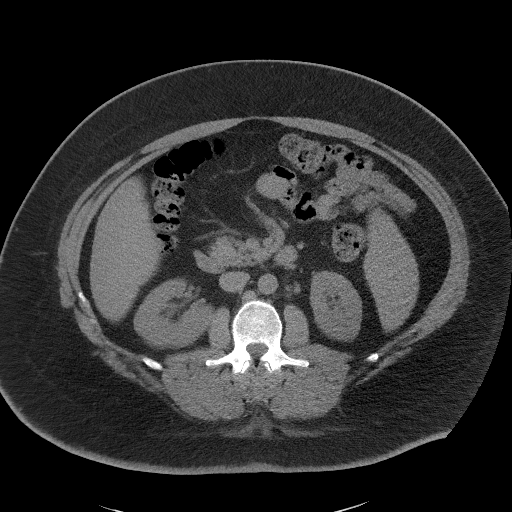
[im 80/105  soft-tissue]
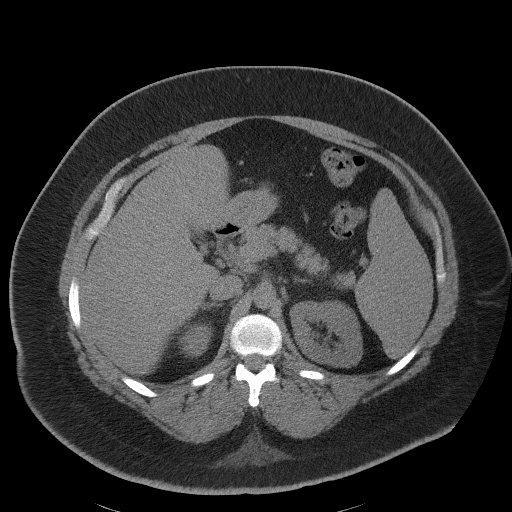
[im 84/105  soft-tissue]
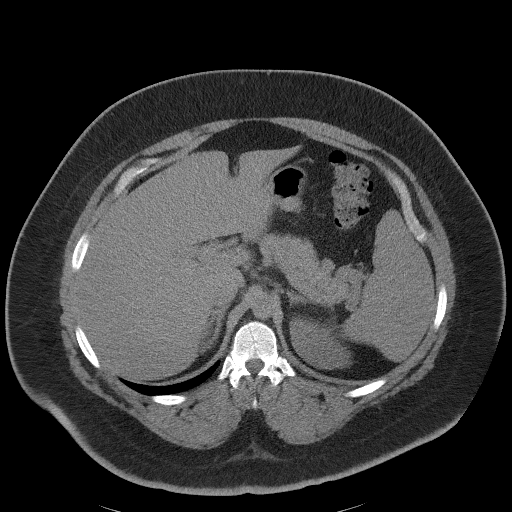
[im 92/105  soft-tissue]
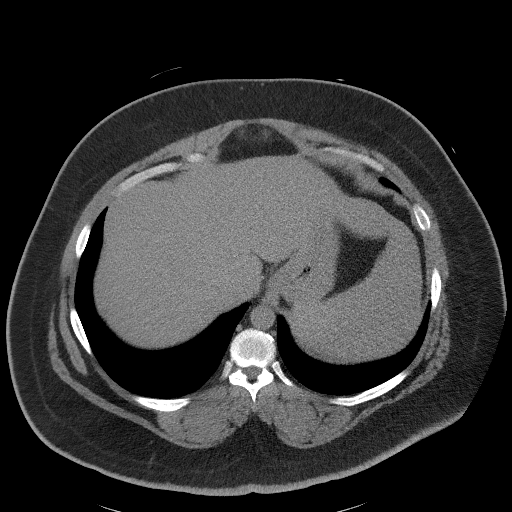
[im 100/105  soft-tissue]
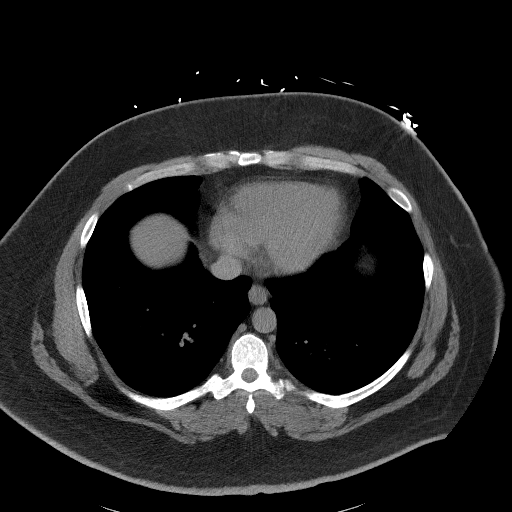

[Series 4: coronal st · coronal · 1.00mm/px · 3 of 113 slices shown]
[im 38/113  soft-tissue]
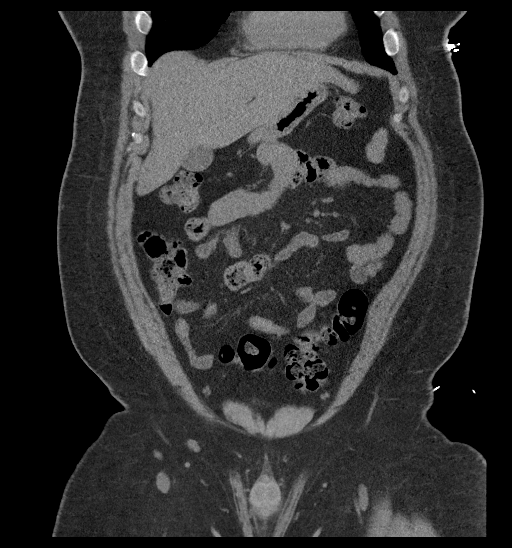
[im 50/113  soft-tissue]
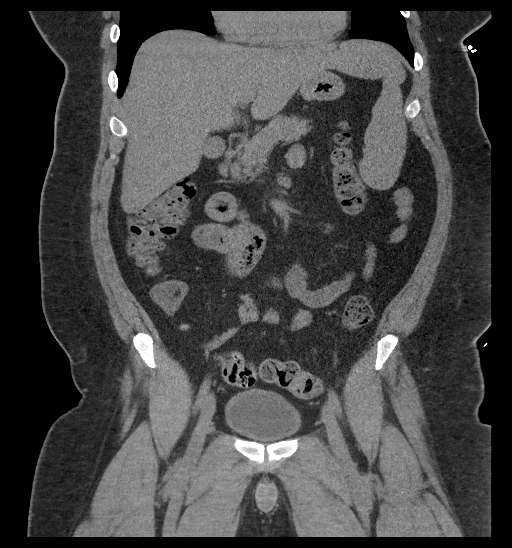
[im 63/113  soft-tissue]
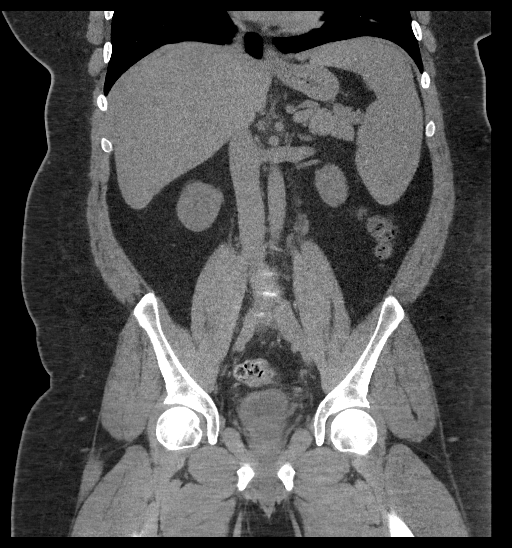

[17 of 46 positions shown; findings below may reference images not displayed]

FINDINGS: Lower chest: No acute abnormality.

Hepatobiliary: No focal liver abnormality is seen. No gallstones,
gallbladder wall thickening, or biliary dilatation.

Pancreas: Unremarkable. No pancreatic ductal dilatation or
surrounding inflammatory changes.

Spleen: Normal in size without focal abnormality.

Adrenals/Urinary Tract: Adrenal glands are unremarkable. Kidneys are
normal, without renal calculi, focal lesion, or hydronephrosis.
Bladder is unremarkable.

Stomach/Bowel: Stomach is within normal limits. Appendix appears
normal. No evidence of bowel wall thickening, distention, or
inflammatory changes.

Vascular/Lymphatic: No significant vascular findings are present. No
enlarged abdominal or pelvic lymph nodes.

Reproductive: Prostate is unremarkable.

Other: No abdominal wall hernia or abnormality. No abdominopelvic
ascites.

Musculoskeletal: No acute or significant osseous findings.
IMPRESSION: No abnormality seen in the abdomen or pelvis.

## 2024-02-23 ENCOUNTER — Inpatient Hospital Stay (HOSPITAL_COMMUNITY)
Admission: EM | Admit: 2024-02-23 | Discharge: 2024-02-27 | DRG: 872 | Disposition: A | Discharge status: Home / Self Care | Attending: Internal Medicine | Admitting: Internal Medicine

## 2024-02-23 ENCOUNTER — Emergency Department (HOSPITAL_COMMUNITY)

## 2024-02-23 ENCOUNTER — Other Ambulatory Visit: Payer: Self-pay

## 2024-02-23 DIAGNOSIS — Z888 Allergy status to other drugs, medicaments and biological substances status: Secondary | ICD-10-CM | POA: Diagnosis not present

## 2024-02-23 DIAGNOSIS — N3001 Acute cystitis with hematuria: Secondary | ICD-10-CM

## 2024-02-23 DIAGNOSIS — Z87820 Personal history of traumatic brain injury: Secondary | ICD-10-CM

## 2024-02-23 DIAGNOSIS — F32A Depression, unspecified: Secondary | ICD-10-CM | POA: Diagnosis present

## 2024-02-23 DIAGNOSIS — N390 Urinary tract infection, site not specified: Secondary | ICD-10-CM | POA: Diagnosis present

## 2024-02-23 DIAGNOSIS — Z6841 Body Mass Index (BMI) 40.0 and over, adult: Secondary | ICD-10-CM | POA: Diagnosis not present

## 2024-02-23 DIAGNOSIS — R17 Unspecified jaundice: Secondary | ICD-10-CM | POA: Diagnosis present

## 2024-02-23 DIAGNOSIS — G35 Multiple sclerosis: Secondary | ICD-10-CM | POA: Diagnosis present

## 2024-02-23 DIAGNOSIS — A4151 Sepsis due to Escherichia coli [E. coli]: Secondary | ICD-10-CM | POA: Diagnosis present

## 2024-02-23 DIAGNOSIS — N400 Enlarged prostate without lower urinary tract symptoms: Secondary | ICD-10-CM | POA: Diagnosis present

## 2024-02-23 DIAGNOSIS — A419 Sepsis, unspecified organism: Principal | ICD-10-CM | POA: Diagnosis present

## 2024-02-23 DIAGNOSIS — R739 Hyperglycemia, unspecified: Secondary | ICD-10-CM | POA: Diagnosis present

## 2024-02-23 DIAGNOSIS — E039 Hypothyroidism, unspecified: Secondary | ICD-10-CM | POA: Diagnosis present

## 2024-02-23 DIAGNOSIS — Z9181 History of falling: Secondary | ICD-10-CM

## 2024-02-23 DIAGNOSIS — Z79899 Other long term (current) drug therapy: Secondary | ICD-10-CM | POA: Diagnosis not present

## 2024-02-23 DIAGNOSIS — R531 Weakness: Secondary | ICD-10-CM | POA: Diagnosis present

## 2024-02-23 DIAGNOSIS — F4310 Post-traumatic stress disorder, unspecified: Secondary | ICD-10-CM | POA: Diagnosis present

## 2024-02-23 DIAGNOSIS — E66813 Obesity, class 3: Secondary | ICD-10-CM | POA: Diagnosis present

## 2024-02-23 LAB — URINALYSIS, W/ REFLEX TO CULTURE (INFECTION SUSPECTED)
Bilirubin Urine: NEGATIVE
Glucose, UA: NEGATIVE mg/dL
Hgb urine dipstick: NEGATIVE
Ketones, ur: NEGATIVE mg/dL
Nitrite: POSITIVE — AB
Protein, ur: NEGATIVE mg/dL
Specific Gravity, Urine: 1.026 (ref 1.005–1.030)
WBC, UA: >50 WBC/hpf (ref 0–5)
pH: 5 (ref 5.0–8.0)

## 2024-02-23 LAB — COMPREHENSIVE METABOLIC PANEL WITH GFR
ALT: 31 U/L (ref 0–44)
AST: 20 U/L (ref 15–41)
Albumin: 4.4 g/dL (ref 3.5–5.0)
Alkaline Phosphatase: 85 U/L (ref 38–126)
Anion gap: 16 — ABNORMAL HIGH (ref 5–15)
BUN: 16 mg/dL (ref 6–20)
CO2: 22 mmol/L (ref 22–32)
Calcium: 9.4 mg/dL (ref 8.9–10.3)
Chloride: 99 mmol/L (ref 98–111)
Creatinine, Ser: 0.95 mg/dL (ref 0.61–1.24)
GFR, Estimated: >60 mL/min (ref >60)
Glucose, Bld: 107 mg/dL — ABNORMAL HIGH (ref 70–99)
Potassium: 3.8 mmol/L (ref 3.5–5.1)
Sodium: 137 mmol/L (ref 135–145)
Total Bilirubin: 1.4 mg/dL — ABNORMAL HIGH (ref 0.0–1.2)
Total Protein: 7 g/dL (ref 6.5–8.1)

## 2024-02-23 LAB — I-STAT CG4 LACTIC ACID, ED
Lactic Acid, Venous: 1.8 mmol/L (ref 0.5–1.9)
Lactic Acid, Venous: 1.5 mmol/L (ref 0.5–1.9)

## 2024-02-23 LAB — CBC WITH DIFFERENTIAL/PLATELET
Abs Immature Granulocytes: 0.1 K/uL — ABNORMAL HIGH (ref 0.00–0.07)
Basophils Absolute: 0 K/uL (ref 0.0–0.1)
Basophils Relative: 0 %
Eosinophils Absolute: 0 K/uL (ref 0.0–0.5)
Eosinophils Relative: 0 %
HCT: 46.5 % (ref 39.0–52.0)
Hemoglobin: 15.9 g/dL (ref 13.0–17.0)
Immature Granulocytes: 1 %
Lymphocytes Relative: 3 %
Lymphs Abs: 0.5 K/uL — ABNORMAL LOW (ref 0.7–4.0)
MCH: 30.7 pg (ref 26.0–34.0)
MCHC: 34.2 g/dL (ref 30.0–36.0)
MCV: 89.8 fL (ref 80.0–100.0)
Monocytes Absolute: 1 K/uL (ref 0.1–1.0)
Monocytes Relative: 6 %
Neutro Abs: 15.8 K/uL — ABNORMAL HIGH (ref 1.7–7.7)
Neutrophils Relative %: 90 %
Platelets: 188 K/uL (ref 150–400)
RBC: 5.18 MIL/uL (ref 4.22–5.81)
RDW: 12.9 % (ref 11.5–15.5)
WBC: 17.5 K/uL — ABNORMAL HIGH (ref 4.0–10.5)
nRBC: 0 % (ref 0.0–0.2)

## 2024-02-23 LAB — RESP PANEL BY RT-PCR (RSV, FLU A&B, COVID)  RVPGX2
Influenza A by PCR: NEGATIVE
Influenza B by PCR: NEGATIVE
Resp Syncytial Virus by PCR: NEGATIVE
SARS Coronavirus 2 by RT PCR: NEGATIVE

## 2024-02-23 LAB — PROTIME-INR
INR: 1.1 (ref 0.8–1.2)
Prothrombin Time: 14.5 s (ref 11.4–15.2)

## 2024-02-23 MED ORDER — LACTATED RINGERS IV SOLN: INTRAVENOUS | Status: DC

## 2024-02-23 MED ORDER — SODIUM CHLORIDE 0.9 % IV BOLUS
1000.0000 mL | Freq: Once | INTRAVENOUS | Status: AC
  Administered 2024-02-23: 1000 mL via INTRAVENOUS

## 2024-02-23 MED ORDER — ONDANSETRON HCL 4 MG PO TABS: 4.0000 mg | ORAL_TABLET | Freq: Four times a day (QID) | ORAL | Status: DC | PRN

## 2024-02-23 MED ORDER — SODIUM CHLORIDE 0.9 % IV SOLN
2.0000 g | Freq: Once | INTRAVENOUS | Status: AC
  Administered 2024-02-23: 2 g via INTRAVENOUS
  Filled 2024-02-23: qty 20

## 2024-02-23 MED ORDER — VITAMIN B-12 1000 MCG PO TABS
1000.0000 ug | ORAL_TABLET | Freq: Every day | ORAL | Status: DC
  Administered 2024-02-23 – 2024-02-27 (×5): 1000 ug via ORAL
  Filled 2024-02-23 (×2): qty 2
  Filled 2024-02-23: qty 1
  Filled 2024-02-23: qty 2
  Filled 2024-02-23: qty 1
  Filled 2024-02-23: qty 2

## 2024-02-23 MED ORDER — SODIUM CHLORIDE 0.9 % IV SOLN
2.0000 g | INTRAVENOUS | Status: DC
  Administered 2024-02-24 – 2024-02-25 (×2): 2 g via INTRAVENOUS
  Filled 2024-02-23 (×2): qty 20

## 2024-02-23 MED ORDER — SERTRALINE HCL 100 MG PO TABS
100.0000 mg | ORAL_TABLET | Freq: Every day | ORAL | Status: DC
  Administered 2024-02-23 – 2024-02-27 (×5): 100 mg via ORAL
  Filled 2024-02-23 (×5): qty 1

## 2024-02-23 MED ORDER — LEVOTHYROXINE SODIUM 75 MCG PO TABS
75.0000 ug | ORAL_TABLET | Freq: Every day | ORAL | Status: DC
  Administered 2024-02-24 – 2024-02-27 (×4): 75 ug via ORAL
  Filled 2024-02-23 (×4): qty 1

## 2024-02-23 MED ORDER — ACETAMINOPHEN 325 MG PO TABS
650.0000 mg | ORAL_TABLET | Freq: Four times a day (QID) | ORAL | Status: DC | PRN
  Administered 2024-02-24 – 2024-02-25 (×2): 650 mg via ORAL
  Filled 2024-02-23 (×2): qty 2

## 2024-02-23 MED ORDER — SODIUM CHLORIDE 0.9 % IV SOLN: INTRAVENOUS | Status: AC

## 2024-02-23 MED ORDER — LACTATED RINGERS IV SOLN
150.0000 mL/h | INTRAVENOUS | Status: DC
  Administered 2024-02-23: 150 mL/h via INTRAVENOUS

## 2024-02-23 MED ORDER — OXCARBAZEPINE 300 MG PO TABS
300.0000 mg | ORAL_TABLET | Freq: Two times a day (BID) | ORAL | Status: DC
  Administered 2024-02-23 – 2024-02-27 (×8): 300 mg via ORAL
  Filled 2024-02-23 (×8): qty 1

## 2024-02-23 MED ORDER — TAMSULOSIN HCL 0.4 MG PO CAPS
0.4000 mg | ORAL_CAPSULE | Freq: Every day | ORAL | Status: DC
Start: 2024-02-23 — End: 2024-02-27
  Administered 2024-02-23 – 2024-02-27 (×5): 0.4 mg via ORAL
  Filled 2024-02-23 (×5): qty 1

## 2024-02-23 MED ORDER — KETOROLAC TROMETHAMINE 30 MG/ML IJ SOLN
30.0000 mg | Freq: Once | INTRAMUSCULAR | Status: AC
  Administered 2024-02-23: 30 mg via INTRAVENOUS
  Filled 2024-02-23: qty 1

## 2024-02-23 MED ORDER — ONDANSETRON HCL 4 MG/2ML IJ SOLN: 4.0000 mg | Freq: Four times a day (QID) | INTRAMUSCULAR | Status: DC | PRN

## 2024-02-23 MED ORDER — ENOXAPARIN SODIUM 80 MG/0.8ML IJ SOSY
80.0000 mg | PREFILLED_SYRINGE | INTRAMUSCULAR | Status: DC
  Administered 2024-02-23 – 2024-02-26 (×4): 80 mg via SUBCUTANEOUS
  Filled 2024-02-23 (×4): qty 0.8

## 2024-02-23 MED ORDER — ACETAMINOPHEN 325 MG PO TABS
650.0000 mg | ORAL_TABLET | Freq: Once | ORAL | Status: AC
  Administered 2024-02-23: 650 mg via ORAL
  Filled 2024-02-23: qty 2

## 2024-02-23 MED ORDER — MELATONIN 3 MG PO TABS
3.0000 mg | ORAL_TABLET | Freq: Every day | ORAL | Status: DC
  Administered 2024-02-23 – 2024-02-26 (×4): 3 mg via ORAL
  Filled 2024-02-23 (×4): qty 1

## 2024-02-23 MED ORDER — ACETAMINOPHEN 650 MG RE SUPP: 650.0000 mg | Freq: Four times a day (QID) | RECTAL | Status: DC | PRN

## 2024-02-23 MED ORDER — LACTATED RINGERS IV BOLUS (SEPSIS)
1000.0000 mL | Freq: Once | INTRAVENOUS | Status: DC
Start: 2024-02-23 — End: 2024-02-23

## 2024-02-23 NOTE — H&P (Signed)
 History and Physical    Patient: Shawn Mercado FMW:[id number] DOB: [birth year] DOA: 02/23/2024 DOS: the patient was seen and examined on 02/23/2024 PCP: Wynn Aurora BRAVO, DO  Patient coming from: Home  Chief Complaint:  Chief Complaint  Patient presents with   Numbness   HPI: Shawn Mercado is a 38 y.o. male with medical history significant of Hyperthyroidism/hypothyroidism, class III obesity, multiple sclerosis, traumatic brain injury, PTSD who presented to the emergency department with complaints of urinary incontinence and generalized weakness, symptoms which are similar to when he had a prior UTI.   No flank pain, dysuria or hematuria. He denied fever, has had chills and was found to be febrile in the emergency department.  No rhinorrhea, sore throat, wheezing or hemoptysis.  No chest pain, palpitations, diaphoresis, PND, orthopnea or pitting edema of the lower extremities.  No abdominal pain, nausea, emesis, diarrhea, constipation, melena or hematochezia.  No polyuria, polydipsia, polyphagia or blurred vision.   Lab work: Urinalysis showed positive nitrites, leukocyte esterase, greater than 50 WBC and many bacteria.  CBC showed a white count of 17.5 with 90% neutrophils, hemoglobin 15.9 g/dL platelets 811.  Coronavirus, influenza and RSV PCR was negative.  Lactic acid x 2 was normal.  CMP showed a glucose of 107 and total bilirubin 1.4 units/L, the rest of the CMP measurements were normal.  Imaging: Portable 1 view chest radiograph with no active disease.   ED course: Initial vital signs were temperature 102.4 F, pulse 96, respiration 28, BP 139/83 mmHg O2 sat 95% on room air the patient received acetaminophen  650 mg p.o. x 1, ceftriaxone  2 g IVPB and normal saline 1000 mL bolus.  Review of Systems: As mentioned in the history of present illness. All other systems reviewed and are negative. Past Medical History:  Diagnosis Date   Hyperthyroidism    Morbid obesity (HCC)    MS (multiple  sclerosis) (HCC)    Post traumatic stress disorder (PTSD)    TBI (traumatic brain injury) (HCC)    Thyroid disease    Past Surgical History:  Procedure Laterality Date   FRACTURE SURGERY     Social History:  reports that he has never smoked. He has never used smokeless tobacco. He reports that he does not drink alcohol and does not use drugs.  Allergies  Allergen Reactions   Copaxone [Glatiramer Acetate] Anaphylaxis    No family history on file.  Prior to Admission medications   Medication Sig Start Date End Date Taking? Authorizing Provider  benzonatate  (TESSALON ) 100 MG capsule Take 1 capsule (100 mg total) by mouth every 8 (eight) hours. 06/17/16   Vicky Charleston, PA-C  docusate sodium  (COLACE) 100 MG capsule Take 1 capsule (100 mg total) by mouth every 12 (twelve) hours. 08/26/12   Pisciotta, Nat, PA-C  glatiramer (COPAXONE) 20 MG/ML injection Inject 20 mg into the skin daily.    [provider]  HYDROcodone -acetaminophen  (NORCO/VICODIN) 5-325 MG per tablet Take 2 tablets by mouth every 4 (four) hours as needed for pain. 08/03/12   Jamelle Lorrayne HERO, NP  Levothyroxine  Sodium (SYNTHROID  PO) Take by mouth.    [provider]  Oxcarbazepine  (TRILEPTAL ) 300 MG tablet Take 300 mg by mouth 2 (two) times daily.    [provider]  sertraline  (ZOLOFT ) 100 MG tablet Take by mouth. 04/07/20   [provider]  XENICAL 120 MG capsule Take 120 mg by mouth at bedtime. 04/10/20   [provider]    Physical Exam: Vitals:   02/23/24  1315 02/23/24 1330 02/23/24 1344 02/23/24 1429  BP:   (!) 170/83   Pulse:   89 93  Resp: 19 18 (!) 29 (!) 23  Temp:    (!) 100.8 F (38.2 C)  TempSrc:    Oral  SpO2:   94% 97%  Weight:      Height:       Physical Exam Vitals and nursing note reviewed.  Constitutional:      General: He is awake. He is not in acute distress.    Appearance: He is ill-appearing.  HENT:     Head: Normocephalic.     Nose: No  rhinorrhea.     Mouth/Throat:     Mouth: Mucous membranes are dry.  Eyes:     General: No scleral icterus.    Pupils: Pupils are equal, round, and reactive to light.  Neck:     Vascular: No JVD.  Cardiovascular:     Rate and Rhythm: Normal rate and regular rhythm.     Heart sounds: S1 normal and S2 normal.  Pulmonary:     Effort: Pulmonary effort is normal.     Breath sounds: Normal breath sounds. No wheezing, rhonchi or rales.  Abdominal:     General: Bowel sounds are normal. There is no distension.     Palpations: Abdomen is soft.     Tenderness: There is no abdominal tenderness. There is no right CVA tenderness or left CVA tenderness.  Musculoskeletal:     Cervical back: Neck supple.     Right lower leg: No edema.     Left lower leg: No edema.  Skin:    General: Skin is warm and dry.  Neurological:     General: No focal deficit present.     Mental Status: He is alert and oriented to person, place, and time.  Psychiatric:        Mood and Affect: Mood normal.        Behavior: Behavior normal. Behavior is cooperative.     Data Reviewed:  Results are pending, will review when available.  Assessment and Plan: Principal Problem:   Sepsis secondary to UTI (HCC) Admit to telemetry/inpatient. Continue ceftriaxone  2 g IVPB daily. Follow-up urine culture and sensitivity. Follow-up blood culture and sensitivity. Follow-up CBC and chemistry in the morning.   Active Problems:   Class 3 obesity Current BMI 57.46 kg/m. Would benefit from lifestyle modifications. Follow-up closely with PCP and/or bariatric clinic.    Depression   PTSD (post-traumatic stress disorder) Continue sertraline  100 mg p.o. daily. Continue oxcarbazepine  300 mg p.o. twice daily.     Hypothyroidism Continue levothyroxine  75 mcg p.o. daily.    Hyperbilirubinemia Minimal elevation. No other LFTs abnormality. Will recheck level in AM.    Hyperglycemia Nonfasting level. Will recheck a fasting  level in AM. Further workup depending on results.    Advance Care Planning:   Code Status: Full Code   Consults:   Family Communication:   Severity of Illness: The appropriate patient status for this patient is INPATIENT. Inpatient status is judged to be reasonable and necessary in order to provide the required intensity of service to ensure the patient's safety. The patient's presenting symptoms, physical exam findings, and initial radiographic and laboratory data in the context of their chronic comorbidities is felt to place them at high risk for further clinical deterioration. Furthermore, it is not anticipated that the patient will be medically stable for discharge from the hospital within 2 midnights of admission.   *  I certify that at the point of admission it is my clinical judgment that the patient will require inpatient hospital care spanning beyond 2 midnights from the point of admission due to high intensity of service, high risk for further deterioration and high frequency of surveillance required.*  Author: Alm Dorn Castor, MD 02/23/2024 2:44 PM  For on call review [email or web address].   This document was prepared using Dragon voice recognition software and may contain some unintended transcription errors.

## 2024-02-23 NOTE — ED Provider Notes (Signed)
 [place] EMERGENCY DEPARTMENT AT [facility] Provider Note   CSN: [id number] Arrival date & time: 02/23/24  1027     Patient presents with: Numbness   Shawn Mercado is a 38 y.o. male.   Patient with history of MS, TBI, morbid obesity, hyperthyroidism presents today with complaints of weakness. Patient somewhat difficult historian, reports that symptoms began this morning. Reports he feels generally weak.  Reports that his weakness is the same throughout his body, denies weakness that is worse in his lower extremities when compared to the upper extremities. States that he slid out of bed this morning onto the floor and his caregiver recently injured their back and could not help him get up and therefore EMS was called. He denies any injuries from his fall. He also notes that he is newly incontinent of urine which started yesterday. Denies any pain. He was unaware that he had a fever. Denies headaches, neck pain, cough, congestion, nausea, vomiting, diarrhea, abdominal pain, dysuria.  No back pain or flank pain.  Denies any numbness in his extremities.  States I just feel like I can't get my body to do what I want it to do. Reports that he normally walks with a cane or a walker, has not tried to walk today. He is unsure if this has happened before. Reports that he gets injections for his MS which has been stable, follows with neurology through the [facility] but has not seen them in over a year he doesn't think.  The history is provided by the patient. No language interpreter was used.       Prior to Admission medications   Medication Sig Start Date End Date Taking? Authorizing Provider  benzonatate  (TESSALON ) 100 MG capsule Take 1 capsule (100 mg total) by mouth every 8 (eight) hours. 06/17/16   Vicky Charleston, PA-C  docusate sodium  (COLACE) 100 MG capsule Take 1 capsule (100 mg total) by mouth every 12 (twelve) hours. 08/26/12   Pisciotta, Nat, PA-C  glatiramer (COPAXONE) 20 MG/ML  injection Inject 20 mg into the skin daily.    [provider]  HYDROcodone -acetaminophen  (NORCO/VICODIN) 5-325 MG per tablet Take 2 tablets by mouth every 4 (four) hours as needed for pain. 08/03/12   Jamelle Lorrayne HERO, NP  Levothyroxine  Sodium (SYNTHROID  PO) Take by mouth.    [provider]  Oxcarbazepine  (TRILEPTAL ) 300 MG tablet Take 300 mg by mouth 2 (two) times daily.    [provider]  sertraline  (ZOLOFT ) 100 MG tablet Take by mouth. 04/07/20   [provider]  XENICAL 120 MG capsule Take 120 mg by mouth at bedtime. 04/10/20   [provider]    Allergies: Copaxone [glatiramer acetate]    Review of Systems  Constitutional:  Positive for fever.  Neurological:  Positive for weakness.  All other systems reviewed and are negative.   Updated Vital Signs BP 139/83 (BP Location: Left Arm)   Pulse 96   Temp (!) 102.4 F (39.1 C) (Oral)   Resp (!) 28   SpO2 95%   Physical Exam Vitals and nursing note reviewed.  Constitutional:      General: He is not in acute distress.    Appearance: Normal appearance. He is obese. He is not ill-appearing, toxic-appearing or diaphoretic.  HENT:     Head: Normocephalic and atraumatic.  Eyes:     Extraocular Movements: Extraocular movements intact.     Pupils: Pupils are equal, round, and reactive to light.  Neck:  Meningeal: Brudzinski's sign and Kernig's sign absent.     Comments: No meningismus Cardiovascular:     Rate and Rhythm: Normal rate.  Pulmonary:     Effort: Pulmonary effort is normal. No respiratory distress.  Abdominal:     General: Abdomen is flat.     Palpations: Abdomen is soft.     Tenderness: There is no abdominal tenderness. There is no right CVA tenderness or left CVA tenderness.  Musculoskeletal:        General: Normal range of motion.     Cervical back: Full passive range of motion without pain, normal range of motion and neck supple.     Comments: No tenderness to palpation  of the cervical, thoracic, or lumbar spine.  No step-offs, lesions, deformity, or overlying skin changes.  Able to fully range his arms  Able to lift both of his legs off of the bed and hold them there.  No sensation changes to the bilateral upper or lower extremities   Skin:    General: Skin is warm and dry.  Neurological:     General: No focal deficit present.     Mental Status: He is alert and oriented to person, place, and time.     Cranial Nerves: No cranial nerve deficit.     Sensory: No sensory deficit.  Psychiatric:        Mood and Affect: Mood normal.        Behavior: Behavior normal.     (all labs ordered are listed, but only abnormal results are displayed) Labs Reviewed  COMPREHENSIVE METABOLIC PANEL WITH GFR - Abnormal; Notable for the following components:      Result Value   Glucose, Bld 107 (*)    Total Bilirubin 1.4 (*)    Anion gap 16 (*)    All other components within normal limits  CBC WITH DIFFERENTIAL/PLATELET - Abnormal; Notable for the following components:   WBC 17.5 (*)    Neutro Abs 15.8 (*)    Lymphs Abs 0.5 (*)    Abs Immature Granulocytes 0.10 (*)    All other components within normal limits  URINALYSIS, W/ REFLEX TO CULTURE (INFECTION SUSPECTED) - Abnormal; Notable for the following components:   Color, Urine AMBER (*)    APPearance HAZY (*)    Nitrite POSITIVE (*)    Leukocytes,Ua MODERATE (*)    Bacteria, UA MANY (*)    All other components within normal limits  RESP PANEL BY RT-PCR (RSV, FLU A&B, COVID)  RVPGX2  CULTURE, BLOOD (ROUTINE X 2)  CULTURE, BLOOD (ROUTINE X 2)  URINE CULTURE  PROTIME-INR  I-STAT CG4 LACTIC ACID, ED  I-STAT CG4 LACTIC ACID, ED    EKG: None  Radiology: [facility] Chest Port 1 View Result Date: 02/23/2024 CLINICAL DATA:  Questionable sepsis. Clemens out of bed. Urinary incontinence beginning last night. EXAM: PORTABLE CHEST 1 VIEW COMPARISON:  06/17/2016 FINDINGS: Lordotic technique is demonstrated. Lungs are  adequately inflated without focal airspace consolidation or effusion. Cardiomediastinal silhouette, bones and soft tissues are unchanged. IMPRESSION: No active disease. Electronically Signed   By: Toribio Agreste M.D.   On: 02/23/2024 12:58     .Critical Care  Performed by: Nora Lauraine LABOR, PA-C Authorized by: Nayah Lukens A, PA-C   Critical care provider statement:    Critical care time (minutes):  35   Critical care was necessary to treat or prevent imminent or life-threatening deterioration of the following conditions:  Sepsis   Critical care was time spent personally  by me on the following activities:  Development of treatment plan with patient or surrogate, discussions with primary provider, evaluation of patient's response to treatment, examination of patient, obtaining history from patient or surrogate, ordering and review of laboratory studies, ordering and review of radiographic studies, pulse oximetry, re-evaluation of patient's condition and review of old charts   Care discussed with: admitting provider      Medications Ordered in the ED  lactated ringers  infusion ( Intravenous New Bag/Given 02/23/24 1428)  enoxaparin  (LOVENOX ) injection 40 mg (has no administration in time range)  acetaminophen  (TYLENOL ) tablet 650 mg (has no administration in time range)    Or  acetaminophen  (TYLENOL ) suppository 650 mg (has no administration in time range)  ondansetron  (ZOFRAN ) tablet 4 mg (has no administration in time range)    Or  ondansetron  (ZOFRAN ) injection 4 mg (has no administration in time range)  lactated ringers  infusion (has no administration in time range)  lactated ringers  bolus 1,000 mL (has no administration in time range)  cefTRIAXone  (ROCEPHIN ) 2 g in sodium chloride  0.9 % 100 mL IVPB (has no administration in time range)  acetaminophen  (TYLENOL ) tablet 650 mg (650 mg Oral Given 02/23/24 1121)  sodium chloride  0.9 % bolus 1,000 mL (0 mLs Intravenous Stopped 02/23/24 1349)   cefTRIAXone  (ROCEPHIN ) 2 g in sodium chloride  0.9 % 100 mL IVPB (0 g Intravenous Stopped 02/23/24 1419)                                    Medical Decision Making Amount and/or Complexity of Data Reviewed Labs: ordered. Radiology: ordered.  Risk OTC drugs. Prescription drug management. Decision regarding hospitalization.   This patient is a 38 y.o. male who presents to the ED for concern of weakness, this involves an extensive number of treatment options, and is a complaint that carries with it a high risk of complications and morbidity. The emergent differential diagnosis prior to evaluation includes, but is not limited to,  CVA, spinal cord injury, ACS, arrhythmia, syncope, orthostatic hypotension, sepsis, hypoglycemia, hypoxia, electrolyte disturbance, endocrine disorder, anemia, environmental exposure, polypharmacy  This is not an exhaustive differential.   Past Medical History / Co-morbidities / Social History:  has a past medical history of Hyperthyroidism, Morbid obesity (HCC), MS (multiple sclerosis) (HCC), Post traumatic stress disorder (PTSD), TBI (traumatic brain injury) (HCC), and Thyroid disease.  Additional history: Chart reviewed. Pertinent results include: unable to review neurology notes as patient gets his care from the [facility]. was seen for a UTI in 2021, urine culture grew pansensitive E. coli.  Physical Exam: Physical exam performed. The pertinent findings include: morbidly obese, generally weak without focal neurologic deficits.  Able to move all extremities equally.  No spinal tenderness.  No flank pain or abdominal tenderness.  Lab Tests: I ordered, and personally interpreted labs.  The pertinent results include:  WBC 17.5, UA infectious. Lactic WNL   Imaging Studies: I ordered imaging studies including CXR. I independently visualized and interpreted imaging which showed NAD. I agree with the radiologist interpretation.   Cardiac Monitoring:  The patient was  maintained on a cardiac monitor.  My attending physician viewed and interpreted the cardiac monitored which showed an underlying rhythm of: sinus rhythm, no STEMI. I agree with this interpretation.   Medications: I ordered medication including Rocephin , tylenol , IV fluids  for UTI, fever, sepsis. Reevaluation of the patient after these medicines showed that the patient improved.  I have reviewed the patients home medicines and have made adjustments as needed.   Disposition: After consideration of the diagnostic results and the patients response to treatment, I feel that patient will require admission for sepsis from UTI.  Also with weakness, worsening functional status in the setting of sepsis with UTI.  Discussing with patient is understanding and in agreement with this.  Discussed patient with hospitalist Dr. Celinda who accepts patient for admission.   This is a shared visit with supervising physician Dr. Levander who has independently evaluated patient & provided guidance in evaluation/management/disposition, in agreement with care   Final diagnoses:  Sepsis, due to unspecified organism, unspecified whether acute organ dysfunction present [facility])  Acute cystitis with hematuria    ED Discharge Orders     None          Nora Lauraine DELENA DEVONNA 02/23/24 1503    Levander [place], MD 02/25/24 1212

## 2024-02-23 NOTE — Plan of Care (Signed)

## 2024-02-23 NOTE — ED Notes (Addendum)
 RN informed of pt's oral temp being 102.4 orally. And messaged Dr and PA about pt's temp as well.

## 2024-02-23 NOTE — ED Notes (Signed)
 RN is aware of pt's BP reading. RN at bedside.

## 2024-02-23 NOTE — ED Triage Notes (Signed)
 BIB EMS from home. Slipped out of bed. Hx of MS. Urinary incontinence starting last night. Feels like he can't move from the waist down, but has full sensation. CBG 120.

## 2024-02-23 NOTE — Sepsis Progress Note (Signed)
 Elink following code sepsis

## 2024-02-23 NOTE — Plan of Care (Signed)
   Problem: Fluid Volume: Goal: Hemodynamic stability will improve Outcome: Progressing   Problem: Clinical Measurements: Goal: Diagnostic test results will improve Outcome: Progressing Goal: Signs and symptoms of infection will decrease Outcome: Progressing

## 2024-02-24 ENCOUNTER — Encounter (HOSPITAL_COMMUNITY): Payer: Self-pay

## 2024-02-24 DIAGNOSIS — N390 Urinary tract infection, site not specified: Secondary | ICD-10-CM | POA: Diagnosis not present

## 2024-02-24 DIAGNOSIS — A419 Sepsis, unspecified organism: Secondary | ICD-10-CM | POA: Diagnosis not present

## 2024-02-24 LAB — BLOOD CULTURE ID PANEL (REFLEXED) - BCID2

## 2024-02-24 LAB — COMPREHENSIVE METABOLIC PANEL WITH GFR
ALT: 21 U/L (ref 0–44)
AST: 18 U/L (ref 15–41)
Albumin: 3.7 g/dL (ref 3.5–5.0)
Alkaline Phosphatase: 77 U/L (ref 38–126)
Anion gap: 14 (ref 5–15)
BUN: 13 mg/dL (ref 6–20)
CO2: 22 mmol/L (ref 22–32)
Calcium: 8.7 mg/dL — ABNORMAL LOW (ref 8.9–10.3)
Chloride: 102 mmol/L (ref 98–111)
Creatinine, Ser: 0.85 mg/dL (ref 0.61–1.24)
GFR, Estimated: >60 mL/min (ref >60)
Glucose, Bld: 97 mg/dL (ref 70–99)
Potassium: 3.5 mmol/L (ref 3.5–5.1)
Sodium: 138 mmol/L (ref 135–145)
Total Bilirubin: 1.8 mg/dL — ABNORMAL HIGH (ref 0.0–1.2)
Total Protein: 6 g/dL — ABNORMAL LOW (ref 6.5–8.1)

## 2024-02-24 LAB — CBC
HCT: 43.2 % (ref 39.0–52.0)
Hemoglobin: 14.1 g/dL (ref 13.0–17.0)
MCH: 29.7 pg (ref 26.0–34.0)
MCHC: 32.6 g/dL (ref 30.0–36.0)
MCV: 91.1 fL (ref 80.0–100.0)
Platelets: 165 K/uL (ref 150–400)
RBC: 4.74 MIL/uL (ref 4.22–5.81)
RDW: 12.9 % (ref 11.5–15.5)
WBC: 14.2 K/uL — ABNORMAL HIGH (ref 4.0–10.5)
nRBC: 0 % (ref 0.0–0.2)

## 2024-02-24 LAB — HIV ANTIBODY (ROUTINE TESTING W REFLEX): HIV Screen 4th Generation wRfx: NONREACTIVE

## 2024-02-24 MED ORDER — LACTATED RINGERS IV SOLN: INTRAVENOUS | Status: DC

## 2024-02-24 NOTE — Progress Notes (Signed)
 PHARMACY - PHYSICIAN COMMUNICATION CRITICAL VALUE ALERT - BLOOD CULTURE IDENTIFICATION (BCID)  Rece Zechman is an 38 y.o. male who presented to [facility] on 02/23/2024 with a chief complaint of urinary incontinence and generalized weakness   Assessment:  2/4 Ecoli, no R  Name of physician (or Provider) Contacted: Oypd  Current antibiotics: CTX 2gm  Changes to prescribed antibiotics recommended:  Patient is on recommended antibiotics - No changes needed  No results found for this or any previous visit.  Leeroy Mace RPh 02/24/2024, 3:20 AM

## 2024-02-24 NOTE — Plan of Care (Signed)
   Problem: Fluid Volume: Goal: Hemodynamic stability will improve Outcome: Progressing   Problem: Clinical Measurements: Goal: Diagnostic test results will improve Outcome: Progressing Goal: Signs and symptoms of infection will decrease Outcome: Progressing   Problem: Respiratory: Goal: Ability to maintain adequate ventilation will improve Outcome: Progressing

## 2024-02-24 NOTE — Plan of Care (Signed)

## 2024-02-24 NOTE — Progress Notes (Addendum)
 PROGRESS NOTE    Shawn Mercado  FMW:[id number] DOB: 1985-08-04 DOA: 02/23/2024 PCP: Wynn Aurora BRAVO, DO   Brief Narrative: 38 year old with past medical history significant for /hypothyroidism, class III obesity, multiple sclerosis, traumatic brain injury, PTSD who presents to the ED complaining of urinary incontinence, generalized weakness.  Patient admitted with sepsis secondary to urinary tract infection and E. coli bacteremia.   Assessment & Plan:   Principal Problem:   Sepsis secondary to UTI [facility]) Active Problems:   Class 3 obesity   Depression   PTSD (post-traumatic stress disorder)   Hypothyroidism   Hyperbilirubinemia  1-Sepsis secondary to urinary tract infection: E. coli bacteremia -Presents with fever, tachypnea, leukocytosis, source of infection urinary tract infection.  UA with positive nitrates and more than 50 white blood cell. - Follow blood cultures; growing E coli.  -Follow urine culture: growing E coli -Continue IV ceftriaxone   Obesity type III: - Needs lifestyle modifications  Depression Traumatic brain injury.  PTSD - Continue sertraline  and oxcarbazepine   Hypothyroidism - Continue with Synthroid    Hyperbilirubinemia: in setting infection. Monitor   Hyperglycemia; resolved Fasting normal.      Estimated body mass index is 56.7 kg/m as calculated from the following:   Height as of this encounter: 5' 7 (1.702 m).   Weight as of this encounter: 164.2 kg.   DVT prophylaxis: Lovenox  Code Status: Full code Family Communication: parents updated over phone.  Disposition Plan:  Status is: Inpatient Remains inpatient appropriate because: management of sepsis    Consultants:  none  Procedures:  none  Antimicrobials:    Subjective: He is alert, denies pain. Denies wound.    Objective: Vitals:   02/23/24 1652 02/23/24 1851 02/23/24 2009 02/24/24 0512  BP: (!) 174/78 125/70 119/71 127/68  Pulse: 85 88 83 80  Resp: 20 20 18 18    Temp: (!) 100.8 F (38.2 C) 99.1 F (37.3 C) 99.3 F (37.4 C) 99.2 F (37.3 C)  TempSrc: Oral Oral Oral Oral  SpO2: 100% 96% 95% 97%  Weight: (!) 164.2 kg     Height: 5' 7 (1.702 m)       Intake/Output Summary (Last 24 hours) at 02/24/2024 0729 Last data filed at 02/24/2024 0500 Gross per 24 hour  Intake 2105.04 ml  Output 1000 ml  Net 1105.04 ml   Filed Weights   02/23/24 1135 02/23/24 1652  Weight: (!) 166.4 kg (!) 164.2 kg    Examination:  General exam: Appears calm and comfortable  Respiratory system: Clear to auscultation. Respiratory effort normal. Cardiovascular system: S1 & S2 heard, RRR. No JVD, murmurs, rubs, gallops or clicks. No pedal edema. Gastrointestinal system: Abdomen is nondistended, soft and nontender. No organomegaly or masses felt. Normal bowel sounds heard. Central nervous system: Alert and oriented.  Extremities: Symmetric 5 x 5 power.  Data Reviewed: I have personally reviewed following labs and imaging studies  CBC: Recent Labs  Lab 02/23/24 1109 02/24/24 0617  WBC 17.5* 14.2*  NEUTROABS 15.8*  --   HGB 15.9 14.1  HCT 46.5 43.2  MCV 89.8 91.1  PLT 188 165   Basic Metabolic Panel: Recent Labs  Lab 02/23/24 1109  NA 137  K 3.8  CL 99  CO2 22  GLUCOSE 107*  BUN 16  CREATININE 0.95  CALCIUM 9.4   GFR: Estimated Creatinine Clearance: 157 mL/min (by C-G formula based on SCr of 0.95 mg/dL). Liver Function Tests: Recent Labs  Lab 02/23/24 1109  AST 20  ALT 31  ALKPHOS 85  BILITOT 1.4*  PROT 7.0  ALBUMIN 4.4   No results for input(s): LIPASE, AMYLASE in the last 168 hours. No results for input(s): AMMONIA in the last 168 hours. Coagulation Profile: Recent Labs  Lab 02/23/24 1632  INR 1.1   Cardiac Enzymes: No results for input(s): CKTOTAL, CKMB, CKMBINDEX, TROPONINI in the last 168 hours. BNP (last 3 results) No results for input(s): PROBNP in the last 8760 hours. HbA1C: No results for input(s):  HGBA1C in the last 72 hours. CBG: No results for input(s): GLUCAP in the last 168 hours. Lipid Profile: No results for input(s): CHOL, HDL, LDLCALC, TRIG, CHOLHDL, LDLDIRECT in the last 72 hours. Thyroid Function Tests: No results for input(s): TSH, T4TOTAL, FREET4, T3FREE, THYROIDAB in the last 72 hours. Anemia Panel: No results for input(s): VITAMINB12, FOLATE, FERRITIN, TIBC, IRON, RETICCTPCT in the last 72 hours. Sepsis Labs: Recent Labs  Lab 02/23/24 1134 02/23/24 1357  LATICACIDVEN 1.8 1.5    Recent Results (from the past 240 hours)  Blood Culture (routine x 2)     Status: None (Preliminary result)   Collection Time: 02/23/24 11:09 AM   Specimen: BLOOD  Result Value Ref Range Status   Specimen Description   Final    BLOOD LEFT ANTECUBITAL Performed at [facility], [address] [place], [place] [address]    Special Requests   Final    BOTTLES DRAWN AEROBIC AND ANAEROBIC Blood Culture results may not be optimal due to an inadequate volume of blood received in culture bottles Performed at [facility], [address] [place], [place] [address]    Culture  Setup Time   Final    GRAM NEGATIVE RODS IN BOTH AEROBIC AND ANAEROBIC BOTTLES Organism ID to follow CRITICAL RESULT CALLED TO, READ BACK BY AND VERIFIED WITH: PHARMD E JACKSON 02/24/2024 @ 0319 BY AB Performed at [facility], [address] [place], [place] [address]    Culture GRAM NEGATIVE RODS  Final   Report Status PENDING  Incomplete  Resp panel by RT-PCR (RSV, Flu A&B, Covid)     Status: None   Collection Time: 02/23/24 11:09 AM   Specimen: Nasal Swab  Result Value Ref Range Status   SARS Coronavirus 2 by RT PCR NEGATIVE NEGATIVE Final    Comment: (NOTE) SARS-CoV-2 target nucleic acids are NOT DETECTED.  The SARS-CoV-2 RNA is generally detectable in upper respiratory specimens during the acute phase of infection. The  lowest concentration of SARS-CoV-2 viral copies this assay can detect is 138 copies/mL. A negative result does not preclude SARS-Cov-2 infection and should not be used as the sole basis for treatment or other patient management decisions. A negative result may occur with  improper specimen collection/handling, submission of specimen other than nasopharyngeal swab, presence of viral mutation(s) within the areas targeted by this assay, and inadequate number of viral copies(<138 copies/mL). A negative result must be combined with clinical observations, patient history, and epidemiological information. The expected result is Negative.  Fact Sheet for Patients:  BloggerCourse.com  Fact Sheet for Healthcare Providers:  SeriousBroker.it  This test is no t yet approved or cleared by the [place]  FDA and  has been authorized for detection and/or diagnosis of SARS-CoV-2 by FDA under an Emergency Use Authorization (EUA). This EUA will remain  in effect (meaning this test can be used) for the duration of the COVID-19 declaration under Section 564(b)(1) of the Act, 21 U.S.C.section 360bbb-3(b)(1), unless the authorization is terminated  or revoked  sooner.       Influenza A by PCR NEGATIVE NEGATIVE Final   Influenza B by PCR NEGATIVE NEGATIVE Final    Comment: (NOTE) The Xpert Xpress SARS-CoV-2/FLU/RSV plus assay is intended as an aid in the diagnosis of influenza from Nasopharyngeal swab specimens and should not be used as a sole basis for treatment. Nasal washings and aspirates are unacceptable for Xpert Xpress SARS-CoV-2/FLU/RSV testing.  Fact Sheet for Patients: BloggerCourse.com  Fact Sheet for Healthcare Providers: SeriousBroker.it  This test is not yet approved or cleared by the [place]  FDA and has been authorized for detection and/or diagnosis of SARS-CoV-2 by FDA under  an Emergency Use Authorization (EUA). This EUA will remain in effect (meaning this test can be used) for the duration of the COVID-19 declaration under Section 564(b)(1) of the Act, 21 U.S.C. section 360bbb-3(b)(1), unless the authorization is terminated or revoked.     Resp Syncytial Virus by PCR NEGATIVE NEGATIVE Final    Comment: (NOTE) Fact Sheet for Patients: BloggerCourse.com  Fact Sheet for Healthcare Providers: SeriousBroker.it  This test is not yet approved or cleared by the [place]  FDA and has been authorized for detection and/or diagnosis of SARS-CoV-2 by FDA under an Emergency Use Authorization (EUA). This EUA will remain in effect (meaning this test can be used) for the duration of the COVID-19 declaration under Section 564(b)(1) of the Act, 21 U.S.C. section 360bbb-3(b)(1), unless the authorization is terminated or revoked.  Performed at [facility], [address], [place], [place] [address]   Blood Culture ID Panel (Reflexed)     Status: Abnormal   Collection Time: 02/23/24 11:09 AM  Result Value Ref Range Status   Enterococcus faecalis NOT DETECTED NOT DETECTED Final   Enterococcus Faecium NOT DETECTED NOT DETECTED Final   Listeria monocytogenes NOT DETECTED NOT DETECTED Final   Staphylococcus species NOT DETECTED NOT DETECTED Final   Staphylococcus aureus (BCID) NOT DETECTED NOT DETECTED Final   Staphylococcus epidermidis NOT DETECTED NOT DETECTED Final   Staphylococcus lugdunensis NOT DETECTED NOT DETECTED Final   Streptococcus species NOT DETECTED NOT DETECTED Final   Streptococcus agalactiae NOT DETECTED NOT DETECTED Final   Streptococcus pneumoniae NOT DETECTED NOT DETECTED Final   Streptococcus pyogenes NOT DETECTED NOT DETECTED Final   A.calcoaceticus-baumannii NOT DETECTED NOT DETECTED Final   Bacteroides fragilis NOT DETECTED NOT DETECTED Final   Enterobacterales DETECTED (A)  NOT DETECTED Final    Comment: Enterobacterales represent a large order of gram negative bacteria, not a single organism. CRITICAL RESULT CALLED TO, READ BACK BY AND VERIFIED WITH: PHARMD E JACKSON 02/24/2024 @ 0319 BY AB    Enterobacter cloacae complex NOT DETECTED NOT DETECTED Final   Escherichia coli DETECTED (A) NOT DETECTED Final    Comment: CRITICAL RESULT CALLED TO, READ BACK BY AND VERIFIED WITH: PHARMD E JACKSON 02/24/2024 @ 0319 BY AB    Klebsiella aerogenes NOT DETECTED NOT DETECTED Final   Klebsiella oxytoca NOT DETECTED NOT DETECTED Final   Klebsiella pneumoniae NOT DETECTED NOT DETECTED Final   Proteus species NOT DETECTED NOT DETECTED Final   Salmonella species NOT DETECTED NOT DETECTED Final   Serratia marcescens NOT DETECTED NOT DETECTED Final   Haemophilus influenzae NOT DETECTED NOT DETECTED Final   Neisseria meningitidis NOT DETECTED NOT DETECTED Final   Pseudomonas aeruginosa NOT DETECTED NOT DETECTED Final   Stenotrophomonas maltophilia NOT DETECTED NOT DETECTED Final   Candida albicans NOT DETECTED NOT DETECTED Final   Candida auris NOT DETECTED NOT DETECTED  Final   Candida glabrata NOT DETECTED NOT DETECTED Final   Candida krusei NOT DETECTED NOT DETECTED Final   Candida parapsilosis NOT DETECTED NOT DETECTED Final   Candida tropicalis NOT DETECTED NOT DETECTED Final   Cryptococcus neoformans/gattii NOT DETECTED NOT DETECTED Final   CTX-M ESBL NOT DETECTED NOT DETECTED Final   Carbapenem resistance IMP NOT DETECTED NOT DETECTED Final   Carbapenem resistance KPC NOT DETECTED NOT DETECTED Final   Carbapenem resistance NDM NOT DETECTED NOT DETECTED Final   Carbapenem resist OXA 48 LIKE NOT DETECTED NOT DETECTED Final   Carbapenem resistance VIM NOT DETECTED NOT DETECTED Final    Comment: Performed at [facility], [address], [place], [place] [address]         Radiology Studies: DG Chest Port 1 View Result Date: 02/23/2024 CLINICAL DATA:   Questionable sepsis. Clemens out of bed. Urinary incontinence beginning last night. EXAM: PORTABLE CHEST 1 VIEW COMPARISON:  06/17/2016 FINDINGS: Lordotic technique is demonstrated. Lungs are adequately inflated without focal airspace consolidation or effusion. Cardiomediastinal silhouette, bones and soft tissues are unchanged. IMPRESSION: No active disease. Electronically Signed   By: Toribio Agreste M.D.   On: 02/23/2024 12:58        Scheduled Meds:  cyanocobalamin   1,000 mcg Oral Daily   enoxaparin  (LOVENOX ) injection  80 mg Subcutaneous Q24H   levothyroxine   75 mcg Oral QAC breakfast   melatonin  3 mg Oral QHS   Oxcarbazepine   300 mg Oral BID   sertraline   100 mg Oral Daily   tamsulosin   0.4 mg Oral Daily   Continuous Infusions:  cefTRIAXone  (ROCEPHIN )  IV     lactated ringers  150 mL/hr at 02/24/24 0511     LOS: 1 day    Time spent: 35 minutes    Gunnard Dorrance A Kismet Facemire, MD [facility]   If 7PM-7AM, please contact night-coverage [email or web address]  02/24/2024, 7:29 AM

## 2024-02-25 DIAGNOSIS — A419 Sepsis, unspecified organism: Secondary | ICD-10-CM | POA: Diagnosis not present

## 2024-02-25 DIAGNOSIS — N390 Urinary tract infection, site not specified: Secondary | ICD-10-CM | POA: Diagnosis not present

## 2024-02-25 LAB — URINE CULTURE: Culture: >=100000 — AB

## 2024-02-25 LAB — CBC
HCT: 43.4 % (ref 39.0–52.0)
Hemoglobin: 14.4 g/dL (ref 13.0–17.0)
MCH: 30.1 pg (ref 26.0–34.0)
MCHC: 33.2 g/dL (ref 30.0–36.0)
MCV: 90.6 fL (ref 80.0–100.0)
Platelets: 152 K/uL (ref 150–400)
RBC: 4.79 MIL/uL (ref 4.22–5.81)
RDW: 12.9 % (ref 11.5–15.5)
WBC: 8.1 K/uL (ref 4.0–10.5)
nRBC: 0 % (ref 0.0–0.2)

## 2024-02-25 LAB — COMPREHENSIVE METABOLIC PANEL WITH GFR
ALT: 22 U/L (ref 0–44)
AST: 18 U/L (ref 15–41)
Albumin: 3.9 g/dL (ref 3.5–5.0)
Alkaline Phosphatase: 81 U/L (ref 38–126)
Anion gap: 16 — ABNORMAL HIGH (ref 5–15)
BUN: 14 mg/dL (ref 6–20)
CO2: 22 mmol/L (ref 22–32)
Calcium: 8.6 mg/dL — ABNORMAL LOW (ref 8.9–10.3)
Chloride: 101 mmol/L (ref 98–111)
Creatinine, Ser: 0.94 mg/dL (ref 0.61–1.24)
GFR, Estimated: >60 mL/min (ref >60)
Glucose, Bld: 135 mg/dL — ABNORMAL HIGH (ref 70–99)
Potassium: 3.7 mmol/L (ref 3.5–5.1)
Sodium: 138 mmol/L (ref 135–145)
Total Bilirubin: 0.9 mg/dL (ref 0.0–1.2)
Total Protein: 5.9 g/dL — ABNORMAL LOW (ref 6.5–8.1)

## 2024-02-25 NOTE — Progress Notes (Signed)
 PROGRESS NOTE    Shawn Mercado  FMW:[id number] DOB: 22-Jan-1986 DOA: 02/23/2024 PCP: Wynn Aurora BRAVO, DO   Brief Narrative: 38 year old with past medical history significant for /hypothyroidism, class III obesity, multiple sclerosis, traumatic brain injury, PTSD who presents to the ED complaining of urinary incontinence, generalized weakness.  Patient admitted with sepsis secondary to urinary tract infection and E. coli bacteremia.   Assessment & Plan:   Principal Problem:   Sepsis secondary to UTI [facility]) Active Problems:   Class 3 obesity   Depression   PTSD (post-traumatic stress disorder)   Hypothyroidism   Hyperbilirubinemia  1-Sepsis secondary to urinary tract infection: E. coli bacteremia -Presents with fever, tachypnea, leukocytosis, source of infection urinary tract infection.  UA with positive nitrates and more than 50 white blood cell. - Follow blood cultures; growing E coli.  -Follow urine culture: growing E coli -Continue IV ceftriaxone  Low grade fever last night.  Continue with IV antibiotics.  Repeat Blood culture.  WBC down to 8.  Obesity type III: - Needs lifestyle modifications  Depression Traumatic brain injury.  PTSD - Continue sertraline  and oxcarbazepine   Hypothyroidism - Continue with Synthroid    Hyperbilirubinemia: in setting infection. Monitor   Hyperglycemia; resolved Fasting normal.      Estimated body mass index is 56.7 kg/m as calculated from the following:   Height as of this encounter: 5' 7 (1.702 m).   Weight as of this encounter: 164.2 kg.   DVT prophylaxis: Lovenox  Code Status: Full code Family Communication: parents updated over phone.  Disposition Plan:  Status is: Inpatient Remains inpatient appropriate because: management of sepsis    Consultants:  none  Procedures:  none  Antimicrobials:    Subjective: Feeling well, denies pain. Eating well.    Objective: Vitals:   02/23/24 2009 02/24/24 0512  02/24/24 1941 02/25/24 0435  BP: 119/71 127/68 (!) 149/80 126/74  Pulse: 83 80 92 85  Resp: 18 18 18 18   Temp: 99.3 F (37.4 C) 99.2 F (37.3 C) (!) 100.5 F (38.1 C) 99.5 F (37.5 C)  TempSrc: Oral Oral Oral Oral  SpO2: 95% 97% 98% 93%  Weight:      Height:        Intake/Output Summary (Last 24 hours) at 02/25/2024 1340 Last data filed at 02/24/2024 2200 Gross per 24 hour  Intake --  Output 1640 ml  Net -1640 ml   Filed Weights   02/23/24 1135 02/23/24 1652  Weight: (!) 166.4 kg (!) 164.2 kg    Examination:  General exam: NAD Respiratory system: CTA Cardiovascular system: S 1, S 2 RRR Gastrointestinal system: BS present, soft, nt Central nervous system: Alert Extremities: Symmetric 5 x 5 power.  Data Reviewed: I have personally reviewed following labs and imaging studies  CBC: Recent Labs  Lab 02/23/24 1109 02/24/24 0617 02/25/24 0830  WBC 17.5* 14.2* 8.1  NEUTROABS 15.8*  --   --   HGB 15.9 14.1 14.4  HCT 46.5 43.2 43.4  MCV 89.8 91.1 90.6  PLT 188 165 152   Basic Metabolic Panel: Recent Labs  Lab 02/23/24 1109 02/24/24 0617  NA 137 138  K 3.8 3.5  CL 99 102  CO2 22 22  GLUCOSE 107* 97  BUN 16 13  CREATININE 0.95 0.85  CALCIUM 9.4 8.7*   GFR: Estimated Creatinine Clearance: 175.5 mL/min (by C-G formula based on SCr of 0.85 mg/dL). Liver Function Tests: Recent Labs  Lab 02/23/24 1109 02/24/24 0617  AST 20 18  ALT 31 21  ALKPHOS 85 77  BILITOT 1.4* 1.8*  PROT 7.0 6.0*  ALBUMIN 4.4 3.7   No results for input(s): LIPASE, AMYLASE in the last 168 hours. No results for input(s): AMMONIA in the last 168 hours. Coagulation Profile: Recent Labs  Lab 02/23/24 1632  INR 1.1   Cardiac Enzymes: No results for input(s): CKTOTAL, CKMB, CKMBINDEX, TROPONINI in the last 168 hours. BNP (last 3 results) No results for input(s): PROBNP in the last 8760 hours. HbA1C: No results for input(s): HGBA1C in the last 72 hours. CBG: No  results for input(s): GLUCAP in the last 168 hours. Lipid Profile: No results for input(s): CHOL, HDL, LDLCALC, TRIG, CHOLHDL, LDLDIRECT in the last 72 hours. Thyroid Function Tests: No results for input(s): TSH, T4TOTAL, FREET4, T3FREE, THYROIDAB in the last 72 hours. Anemia Panel: No results for input(s): VITAMINB12, FOLATE, FERRITIN, TIBC, IRON, RETICCTPCT in the last 72 hours. Sepsis Labs: Recent Labs  Lab 02/23/24 1134 02/23/24 1357  LATICACIDVEN 1.8 1.5    Recent Results (from the past 240 hours)  Blood Culture (routine x 2)     Status: None (Preliminary result)   Collection Time: 02/23/24 11:04 AM   Specimen: BLOOD  Result Value Ref Range Status   Specimen Description   Final    BLOOD RIGHT ANTECUBITAL Performed at [facility], [address] [place], [place] [address]    Special Requests   Final    BOTTLES DRAWN AEROBIC AND ANAEROBIC Blood Culture results may not be optimal due to an inadequate volume of blood received in culture bottles Performed at [facility], [address] [place], [place] [address]    Culture   Final    NO GROWTH 2 DAYS Performed at [facility], [address], [place], [place] [address]    Report Status PENDING  Incomplete  Blood Culture (routine x 2)     Status: Abnormal (Preliminary result)   Collection Time: 02/23/24 11:09 AM   Specimen: BLOOD  Result Value Ref Range Status   Specimen Description   Final    BLOOD LEFT ANTECUBITAL Performed at [facility], [address] [place], [place] [address]    Special Requests   Final    BOTTLES DRAWN AEROBIC AND ANAEROBIC Blood Culture results may not be optimal due to an inadequate volume of blood received in culture bottles Performed at [facility], [address] [place], [place] [address]    Culture  Setup Time   Final    GRAM NEGATIVE RODS IN BOTH AEROBIC AND  ANAEROBIC BOTTLES CRITICAL RESULT CALLED TO, READ BACK BY AND VERIFIED WITH: PHARMD E JACKSON 02/24/2024 @ 0319 BY AB    Culture (A)  Final    ESCHERICHIA COLI SUSCEPTIBILITIES TO FOLLOW Performed at [facility], [address], [place], [place] [address]    Report Status PENDING  Incomplete  Resp panel by RT-PCR (RSV, Flu A&B, Covid)     Status: None   Collection Time: 02/23/24 11:09 AM   Specimen: Nasal Swab  Result Value Ref Range Status   SARS Coronavirus 2 by RT PCR NEGATIVE NEGATIVE Final    Comment: (NOTE) SARS-CoV-2 target nucleic acids are NOT DETECTED.  The SARS-CoV-2 RNA is generally detectable in upper respiratory specimens during the acute phase of infection. The lowest concentration of SARS-CoV-2 viral copies this assay can detect is 138 copies/mL. A negative result does not preclude SARS-Cov-2 infection and should not be used as the  sole basis for treatment or other patient management decisions. A negative result may occur with  improper specimen collection/handling, submission of specimen other than nasopharyngeal swab, presence of viral mutation(s) within the areas targeted by this assay, and inadequate number of viral copies(<138 copies/mL). A negative result must be combined with clinical observations, patient history, and epidemiological information. The expected result is Negative.  Fact Sheet for Patients:  BloggerCourse.com  Fact Sheet for Healthcare Providers:  SeriousBroker.it  This test is no t yet approved or cleared by the [place]  FDA and  has been authorized for detection and/or diagnosis of SARS-CoV-2 by FDA under an Emergency Use Authorization (EUA). This EUA will remain  in effect (meaning this test can be used) for the duration of the COVID-19 declaration under Section 564(b)(1) of the Act, 21 U.S.C.section 360bbb-3(b)(1), unless the authorization is terminated  or revoked sooner.        Influenza A by PCR NEGATIVE NEGATIVE Final   Influenza B by PCR NEGATIVE NEGATIVE Final    Comment: (NOTE) The Xpert Xpress SARS-CoV-2/FLU/RSV plus assay is intended as an aid in the diagnosis of influenza from Nasopharyngeal swab specimens and should not be used as a sole basis for treatment. Nasal washings and aspirates are unacceptable for Xpert Xpress SARS-CoV-2/FLU/RSV testing.  Fact Sheet for Patients: BloggerCourse.com  Fact Sheet for Healthcare Providers: SeriousBroker.it  This test is not yet approved or cleared by the [place]  FDA and has been authorized for detection and/or diagnosis of SARS-CoV-2 by FDA under an Emergency Use Authorization (EUA). This EUA will remain in effect (meaning this test can be used) for the duration of the COVID-19 declaration under Section 564(b)(1) of the Act, 21 U.S.C. section 360bbb-3(b)(1), unless the authorization is terminated or revoked.     Resp Syncytial Virus by PCR NEGATIVE NEGATIVE Final    Comment: (NOTE) Fact Sheet for Patients: BloggerCourse.com  Fact Sheet for Healthcare Providers: SeriousBroker.it  This test is not yet approved or cleared by the [place]  FDA and has been authorized for detection and/or diagnosis of SARS-CoV-2 by FDA under an Emergency Use Authorization (EUA). This EUA will remain in effect (meaning this test can be used) for the duration of the COVID-19 declaration under Section 564(b)(1) of the Act, 21 U.S.C. section 360bbb-3(b)(1), unless the authorization is terminated or revoked.  Performed at [facility], [address], [place], [place] [address]   Urine Culture     Status: Abnormal   Collection Time: 02/23/24 11:09 AM   Specimen: Urine, Random  Result Value Ref Range Status   Specimen Description   Final    URINE, RANDOM Performed at [facility], [address] [place], [place] [address]    Special Requests   Final    NONE Reflexed from [id number] Performed at [facility], [address], [place], [place] [address]    Culture >=100,000 COLONIES/mL ESCHERICHIA COLI (A)  Final   Report Status 02/25/2024 FINAL  Final   Organism ID, Bacteria ESCHERICHIA COLI (A)  Final      Susceptibility   Escherichia coli - MIC*    AMPICILLIN >=32 RESISTANT Resistant     CEFAZOLIN (URINE) Value in next row Resistant      >=32 RESISTANTThis is a modified FDA-approved test that has been validated and its performance characteristics determined by the reporting laboratory.  This laboratory is certified under the Clinical Laboratory Improvement Amendments CLIA as qualified to perform high complexity clinical laboratory testing.  CEFEPIME Value in next row Sensitive      >=32 RESISTANTThis is a modified FDA-approved test that has been validated and its performance characteristics determined by the reporting laboratory.  This laboratory is certified under the Clinical Laboratory Improvement Amendments CLIA as qualified to perform high complexity clinical laboratory testing.    ERTAPENEM Value in next row Sensitive      >=32 RESISTANTThis is a modified FDA-approved test that has been validated and its performance characteristics determined by the reporting laboratory.  This laboratory is certified under the Clinical Laboratory Improvement Amendments CLIA as qualified to perform high complexity clinical laboratory testing.    CEFTRIAXONE  Value in next row Sensitive      >=32 RESISTANTThis is a modified FDA-approved test that has been validated and its performance characteristics determined by the reporting laboratory.  This laboratory is certified under the Clinical Laboratory Improvement Amendments CLIA as qualified to perform high complexity clinical laboratory testing.    CIPROFLOXACIN  Value in next row Sensitive      >=32  RESISTANTThis is a modified FDA-approved test that has been validated and its performance characteristics determined by the reporting laboratory.  This laboratory is certified under the Clinical Laboratory Improvement Amendments CLIA as qualified to perform high complexity clinical laboratory testing.    GENTAMICIN Value in next row Sensitive      >=32 RESISTANTThis is a modified FDA-approved test that has been validated and its performance characteristics determined by the reporting laboratory.  This laboratory is certified under the Clinical Laboratory Improvement Amendments CLIA as qualified to perform high complexity clinical laboratory testing.    NITROFURANTOIN Value in next row Sensitive      >=32 RESISTANTThis is a modified FDA-approved test that has been validated and its performance characteristics determined by the reporting laboratory.  This laboratory is certified under the Clinical Laboratory Improvement Amendments CLIA as qualified to perform high complexity clinical laboratory testing.    TRIMETH/SULFA Value in next row Sensitive      >=32 RESISTANTThis is a modified FDA-approved test that has been validated and its performance characteristics determined by the reporting laboratory.  This laboratory is certified under the Clinical Laboratory Improvement Amendments CLIA as qualified to perform high complexity clinical laboratory testing.    AMPICILLIN/SULBACTAM Value in next row Resistant      >=32 RESISTANTThis is a modified FDA-approved test that has been validated and its performance characteristics determined by the reporting laboratory.  This laboratory is certified under the Clinical Laboratory Improvement Amendments CLIA as qualified to perform high complexity clinical laboratory testing.    PIP/TAZO Value in next row Sensitive      8 SENSITIVEThis is a modified FDA-approved test that has been validated and its performance characteristics determined by the reporting laboratory.  This  laboratory is certified under the Clinical Laboratory Improvement Amendments CLIA as qualified to perform high complexity clinical laboratory testing.    MEROPENEM Value in next row Sensitive      8 SENSITIVEThis is a modified FDA-approved test that has been validated and its performance characteristics determined by the reporting laboratory.  This laboratory is certified under the Clinical Laboratory Improvement Amendments CLIA as qualified to perform high complexity clinical laboratory testing.    * >=100,000 COLONIES/mL ESCHERICHIA COLI  Blood Culture ID Panel (Reflexed)     Status: Abnormal   Collection Time: 02/23/24 11:09 AM  Result Value Ref Range Status   Enterococcus faecalis NOT DETECTED NOT DETECTED Final   Enterococcus Faecium NOT DETECTED NOT DETECTED Final  Listeria monocytogenes NOT DETECTED NOT DETECTED Final   Staphylococcus species NOT DETECTED NOT DETECTED Final   Staphylococcus aureus (BCID) NOT DETECTED NOT DETECTED Final   Staphylococcus epidermidis NOT DETECTED NOT DETECTED Final   Staphylococcus lugdunensis NOT DETECTED NOT DETECTED Final   Streptococcus species NOT DETECTED NOT DETECTED Final   Streptococcus agalactiae NOT DETECTED NOT DETECTED Final   Streptococcus pneumoniae NOT DETECTED NOT DETECTED Final   Streptococcus pyogenes NOT DETECTED NOT DETECTED Final   A.calcoaceticus-baumannii NOT DETECTED NOT DETECTED Final   Bacteroides fragilis NOT DETECTED NOT DETECTED Final   Enterobacterales DETECTED (A) NOT DETECTED Final    Comment: Enterobacterales represent a large order of gram negative bacteria, not a single organism. CRITICAL RESULT CALLED TO, READ BACK BY AND VERIFIED WITH: PHARMD E JACKSON 02/24/2024 @ 0319 BY AB    Enterobacter cloacae complex NOT DETECTED NOT DETECTED Final   Escherichia coli DETECTED (A) NOT DETECTED Final    Comment: CRITICAL RESULT CALLED TO, READ BACK BY AND VERIFIED WITH: PHARMD E JACKSON 02/24/2024 @ 0319 BY AB    Klebsiella  aerogenes NOT DETECTED NOT DETECTED Final   Klebsiella oxytoca NOT DETECTED NOT DETECTED Final   Klebsiella pneumoniae NOT DETECTED NOT DETECTED Final   Proteus species NOT DETECTED NOT DETECTED Final   Salmonella species NOT DETECTED NOT DETECTED Final   Serratia marcescens NOT DETECTED NOT DETECTED Final   Haemophilus influenzae NOT DETECTED NOT DETECTED Final   Neisseria meningitidis NOT DETECTED NOT DETECTED Final   Pseudomonas aeruginosa NOT DETECTED NOT DETECTED Final   Stenotrophomonas maltophilia NOT DETECTED NOT DETECTED Final   Candida albicans NOT DETECTED NOT DETECTED Final   Candida auris NOT DETECTED NOT DETECTED Final   Candida glabrata NOT DETECTED NOT DETECTED Final   Candida krusei NOT DETECTED NOT DETECTED Final   Candida parapsilosis NOT DETECTED NOT DETECTED Final   Candida tropicalis NOT DETECTED NOT DETECTED Final   Cryptococcus neoformans/gattii NOT DETECTED NOT DETECTED Final   CTX-M ESBL NOT DETECTED NOT DETECTED Final   Carbapenem resistance IMP NOT DETECTED NOT DETECTED Final   Carbapenem resistance KPC NOT DETECTED NOT DETECTED Final   Carbapenem resistance NDM NOT DETECTED NOT DETECTED Final   Carbapenem resist OXA 48 LIKE NOT DETECTED NOT DETECTED Final   Carbapenem resistance VIM NOT DETECTED NOT DETECTED Final    Comment: Performed at [facility], [address] [place], [place] [address]         Radiology Studies: No results found.       Scheduled Meds:  cyanocobalamin   1,000 mcg Oral Daily   enoxaparin  (LOVENOX ) injection  80 mg Subcutaneous Q24H   levothyroxine   75 mcg Oral QAC breakfast   melatonin  3 mg Oral QHS   Oxcarbazepine   300 mg Oral BID   sertraline   100 mg Oral Daily   tamsulosin   0.4 mg Oral Daily   Continuous Infusions:  cefTRIAXone  (ROCEPHIN )  IV 2 g (02/25/24 1317)   lactated ringers  75 mL/hr at 02/24/24 2030     LOS: 2 days    Time spent: 35 minutes    Trampas Stettner A Xayden Linsey, MD [facility]   If 7PM-7AM, please contact night-coverage [email or web address]  02/25/2024, 1:40 PM

## 2024-02-25 NOTE — TOC Initial Note (Signed)
 Transition of Care [facility]) - Initial/Assessment Note    Patient Details  Name: Shawn Mercado MRN: [id number] Date of Birth: [birth year]  [facility] CM/SW Contact:    Sheri ONEIDA Sharps, LCSW Phone Number: 02/25/2024, 10:24 AM  Clinical Narrative:                 Pt from home w/ roommates. Pt continues medical workup. TOC following for dc needs.     Barriers to Discharge: Continued Medical Work up   Patient Goals and CMS Choice Patient states their goals for this hospitalization and ongoing recovery are:: return home   Choice offered to / list presented to : NA      Expected Discharge Plan and Services In-house Referral: NA Discharge Planning Services: NA   Living arrangements for the past 2 months: Single Family Home                 DME Arranged: N/A DME Agency: NA       HH Arranged: NA HH Agency: NA        Prior Living Arrangements/Services Living arrangements for the past 2 months: Single Family Home Lives with:: Roommate Patient language and need for interpreter reviewed:: Yes Do you feel safe going back to the place where you live?: Yes      Need for Family Participation in Patient Care: Yes (Comment) Care giver support system in place?: Yes (comment)   Criminal Activity/Legal Involvement Pertinent to Current Situation/Hospitalization: No - Comment as needed  Activities of Daily Living   ADL Screening (condition at time of admission) Independently performs ADLs?: No Does the patient have a NEW difficulty with bathing/dressing/toileting/self-feeding that is expected to last >3 days?: No Does the patient have a NEW difficulty with getting in/out of bed, walking, or climbing stairs that is expected to last >3 days?: No Does the patient have a NEW difficulty with communication that is expected to last >3 days?: No Is the patient deaf or have difficulty hearing?: No Does the patient have difficulty seeing, even when wearing glasses/contacts?: No Does  the patient have difficulty concentrating, remembering, or making decisions?: No  Permission Sought/Granted                  Emotional Assessment Appearance:: Appears stated age     Orientation: : Oriented to Self, Oriented to Place, Oriented to  Time, Oriented to Situation Alcohol / Substance Use: Not Applicable Psych Involvement: No (comment)  Admission diagnosis:  Acute cystitis with hematuria [N30.01] Sepsis secondary to UTI (HCC) [A41.9, N39.0] Sepsis, due to unspecified organism, unspecified whether acute organ dysfunction present [facility]) [A41.9] Patient Active Problem List   Diagnosis Date Noted   Sepsis secondary to UTI (HCC) 02/23/2024   Class 3 obesity 02/23/2024   Depression 02/23/2024   PTSD (post-traumatic stress disorder) 02/23/2024   Hypothyroidism 02/23/2024   Hyperbilirubinemia 02/23/2024   PCP:  Piva, Enrico E, DO Pharmacy:   [facility] [address] [place] [address] Phone: [phone] Fax: [phone]     Social Drivers of Health (SDOH) Social History: SDOH Screenings   Food Insecurity: No Food Insecurity (02/23/2024)  Housing: Low Risk  (02/23/2024)  Transportation Needs: No Transportation Needs (02/23/2024)  Utilities: Not At Risk (02/23/2024)  Tobacco Use: Low Risk  (02/24/2024)   SDOH Interventions:     Readmission Risk Interventions    02/25/2024   10:10 AM  Readmission Risk Prevention Plan  Post Dischage Appt Complete  Medication  Screening Complete  Transportation Screening Complete

## 2024-02-25 NOTE — Plan of Care (Signed)
  Problem: Fluid Volume: Goal: Hemodynamic stability will improve Outcome: Progressing   Problem: Clinical Measurements: Goal: Signs and symptoms of infection will decrease Outcome: Progressing   Problem: Respiratory: Goal: Ability to maintain adequate ventilation will improve Outcome: Progressing   Problem: Clinical Measurements: Goal: Ability to maintain clinical measurements within normal limits will improve Outcome: Progressing Goal: Will remain free from infection Outcome: Progressing   Problem: Activity: Goal: Risk for activity intolerance will decrease Outcome: Progressing   Problem: Nutrition: Goal: Adequate nutrition will be maintained Outcome: Progressing   Problem: Elimination: Goal: Will not experience complications related to bowel motility Outcome: Progressing   Problem: Pain Managment: Goal: General experience of comfort will improve and/or be controlled Outcome: Progressing   Problem: Skin Integrity: Goal: Risk for impaired skin integrity will decrease Outcome: Progressing

## 2024-02-26 DIAGNOSIS — N390 Urinary tract infection, site not specified: Secondary | ICD-10-CM | POA: Diagnosis not present

## 2024-02-26 DIAGNOSIS — A419 Sepsis, unspecified organism: Secondary | ICD-10-CM | POA: Diagnosis not present

## 2024-02-26 LAB — BLOOD CULTURE ID PANEL (REFLEXED) - BCID2

## 2024-02-26 LAB — CULTURE, BLOOD (ROUTINE X 2)

## 2024-02-26 LAB — COMPREHENSIVE METABOLIC PANEL WITH GFR
ALT: 28 U/L (ref 0–44)
AST: 29 U/L (ref 15–41)
Albumin: 4 g/dL (ref 3.5–5.0)
Alkaline Phosphatase: 81 U/L (ref 38–126)
Anion gap: 14 (ref 5–15)
BUN: 14 mg/dL (ref 6–20)
CO2: 24 mmol/L (ref 22–32)
Calcium: 9 mg/dL (ref 8.9–10.3)
Chloride: 101 mmol/L (ref 98–111)
Creatinine, Ser: 0.87 mg/dL (ref 0.61–1.24)
GFR, Estimated: >60 mL/min (ref >60)
Glucose, Bld: 90 mg/dL (ref 70–99)
Potassium: 3.4 mmol/L — ABNORMAL LOW (ref 3.5–5.1)
Sodium: 139 mmol/L (ref 135–145)
Total Bilirubin: 0.7 mg/dL (ref 0.0–1.2)
Total Protein: 6.9 g/dL (ref 6.5–8.1)

## 2024-02-26 MED ORDER — CIPROFLOXACIN HCL 500 MG PO TABS
750.0000 mg | ORAL_TABLET | Freq: Two times a day (BID) | ORAL | Status: DC
Start: 2024-02-26 — End: 2024-03-01
  Administered 2024-02-26 – 2024-02-27 (×3): 750 mg via ORAL
  Filled 2024-02-26 (×3): qty 2

## 2024-02-26 MED ORDER — POTASSIUM CHLORIDE CRYS ER 20 MEQ PO TBCR
40.0000 meq | EXTENDED_RELEASE_TABLET | Freq: Once | ORAL | Status: AC
Start: 2024-02-26 — End: 2024-02-26
  Administered 2024-02-26: 40 meq via ORAL
  Filled 2024-02-26: qty 2

## 2024-02-26 NOTE — Progress Notes (Signed)
 PROGRESS NOTE    Shawn Mercado  FMW:[id number] DOB: 07-22-1985 DOA: 02/23/2024 PCP: Wynn Aurora BRAVO, DO   Brief Narrative: 38 year old with past medical history significant for /hypothyroidism, class III obesity, multiple sclerosis, traumatic brain injury, PTSD who presents to the ED complaining of urinary incontinence, generalized weakness.  Patient admitted with sepsis secondary to urinary tract infection and E. coli bacteremia.   Assessment & Plan:   Principal Problem:   Sepsis secondary to UTI [facility]) Active Problems:   Class 3 obesity   Depression   PTSD (post-traumatic stress disorder)   Hypothyroidism   Hyperbilirubinemia  1-Sepsis secondary to urinary tract infection: E. coli bacteremia -Presents with fever, tachypnea, leukocytosis, source of infection urinary tract infection.  UA with positive nitrates and more than 50 white blood cell. - Blood cultures; growing E coli.  -Urine culture: growing E coli -Treated with IV ceftriaxone  -Repeat Blood culture due to low grade fever. Now growing staph, might be contaminate, follow Identification  -WBC down to 8. He has been transition to Levaquin. Would treat for 7-10 days.   1/4 Blood culture positive for Staph/.  Likely contaminate. Follow Identification.   Obesity type III: - Needs lifestyle modifications  Depression Traumatic brain injury.  PTSD - Continue sertraline  and oxcarbazepine   Hypothyroidism - Continue with Synthroid    Hyperbilirubinemia: in setting infection. Monitor   Hyperglycemia; resolved Fasting normal.      Estimated body mass index is 56.7 kg/m as calculated from the following:   Height as of this encounter: 5' 7 (1.702 m).   Weight as of this encounter: 164.2 kg.   DVT prophylaxis: Lovenox  Code Status: Full code Family Communication: parents updated over phone.  Disposition Plan:  Status is: Inpatient Remains inpatient appropriate because: home 9/24 after identification of repeated  Blood cultures from 9/22.    Consultants:  none  Procedures:  none  Antimicrobials:    Subjective: Feeling well, no new complaints. He has been walking in his room    Objective: Vitals:   02/25/24 0435 02/25/24 1422 02/25/24 1947 02/26/24 0510  BP: 126/74 (!) 153/99 (!) 149/86 (!) 148/99  Pulse: 85 74 75 61  Resp: 18 19 18 17   Temp: 99.5 F (37.5 C) 98.5 F (36.9 C) 99.8 F (37.7 C) 98 F (36.7 C)  TempSrc: Oral Oral Oral Oral  SpO2: 93% 96% 97% 99%  Weight:      Height:        Intake/Output Summary (Last 24 hours) at 02/26/2024 1441 Last data filed at 02/26/2024 0900 Gross per 24 hour  Intake 2433.79 ml  Output 2900 ml  Net -466.21 ml   Filed Weights   02/23/24 1135 02/23/24 1652  Weight: (!) 166.4 kg (!) 164.2 kg    Examination:  General exam: NAD, obese Respiratory system: CTA Cardiovascular system: S 1, S 2 RRR Gastrointestinal system: BS present, soft, nt Central nervous system: alert Extremities: no edema  Data Reviewed: I have personally reviewed following labs and imaging studies  CBC: Recent Labs  Lab 02/23/24 1109 02/24/24 0617 02/25/24 0830  WBC 17.5* 14.2* 8.1  NEUTROABS 15.8*  --   --   HGB 15.9 14.1 14.4  HCT 46.5 43.2 43.4  MCV 89.8 91.1 90.6  PLT 188 165 152   Basic Metabolic Panel: Recent Labs  Lab 02/23/24 1109 02/24/24 0617 02/25/24 0830 02/26/24 0539  NA 137 138 138 139  K 3.8 3.5 3.7 3.4*  CL 99 102 101 101  CO2 22 22 22 24   GLUCOSE  107* 97 135* 90  BUN 16 13 14 14   CREATININE 0.95 0.85 0.94 0.87  CALCIUM 9.4 8.7* 8.6* 9.0   GFR: Estimated Creatinine Clearance: 171.5 mL/min (by C-G formula based on SCr of 0.87 mg/dL). Liver Function Tests: Recent Labs  Lab 02/23/24 1109 02/24/24 0617 02/25/24 0830 02/26/24 0539  AST 20 18 18 29   ALT 31 21 22 28   ALKPHOS 85 77 81 81  BILITOT 1.4* 1.8* 0.9 0.7  PROT 7.0 6.0* 5.9* 6.9  ALBUMIN 4.4 3.7 3.9 4.0   No results for input(s): LIPASE, AMYLASE in the last  168 hours. No results for input(s): AMMONIA in the last 168 hours. Coagulation Profile: Recent Labs  Lab 02/23/24 1632  INR 1.1   Cardiac Enzymes: No results for input(s): CKTOTAL, CKMB, CKMBINDEX, TROPONINI in the last 168 hours. BNP (last 3 results) No results for input(s): PROBNP in the last 8760 hours. HbA1C: No results for input(s): HGBA1C in the last 72 hours. CBG: No results for input(s): GLUCAP in the last 168 hours. Lipid Profile: No results for input(s): CHOL, HDL, LDLCALC, TRIG, CHOLHDL, LDLDIRECT in the last 72 hours. Thyroid Function Tests: No results for input(s): TSH, T4TOTAL, FREET4, T3FREE, THYROIDAB in the last 72 hours. Anemia Panel: No results for input(s): VITAMINB12, FOLATE, FERRITIN, TIBC, IRON, RETICCTPCT in the last 72 hours. Sepsis Labs: Recent Labs  Lab 02/23/24 1134 02/23/24 1357  LATICACIDVEN 1.8 1.5    Recent Results (from the past 240 hours)  Blood Culture (routine x 2)     Status: None (Preliminary result)   Collection Time: 02/23/24 11:04 AM   Specimen: BLOOD  Result Value Ref Range Status   Specimen Description   Final    BLOOD RIGHT ANTECUBITAL Performed at [facility], [address], [place], [place] [address]    Special Requests   Final    BOTTLES DRAWN AEROBIC AND ANAEROBIC Blood Culture results may not be optimal due to an inadequate volume of blood received in culture bottles Performed at [facility], [address] [place], [place] [address]    Culture   Final    NO GROWTH 3 DAYS Performed at [facility], [address], [place], [place] [address]    Report Status PENDING  Incomplete  Blood Culture (routine x 2)     Status: Abnormal   Collection Time: 02/23/24 11:09 AM   Specimen: BLOOD  Result Value Ref Range Status   Specimen Description   Final    BLOOD LEFT ANTECUBITAL Performed at [facility], [address] [place], [place] [address]    Special Requests   Final    BOTTLES DRAWN AEROBIC AND ANAEROBIC Blood Culture results may not be optimal due to an inadequate volume of blood received in culture bottles Performed at [facility], [address] [place], [place] [address]    Culture  Setup Time   Final    GRAM NEGATIVE RODS IN BOTH AEROBIC AND ANAEROBIC BOTTLES CRITICAL RESULT CALLED TO, READ BACK BY AND VERIFIED WITH: PHARMD E JACKSON 02/24/2024 @ 0319 BY AB Performed at [facility], [address], [place], [place] [address]    Culture ESCHERICHIA COLI (A)  Final   Report Status 02/26/2024 FINAL  Final   Organism ID, Bacteria ESCHERICHIA COLI  Final      Susceptibility   Escherichia coli - MIC*    AMPICILLIN >=32 RESISTANT Resistant     CEFAZOLIN (NON-URINE) 16  RESISTANT Resistant     CEFEPIME <=0.12 SENSITIVE Sensitive     ERTAPENEM <=0.12 SENSITIVE Sensitive     CEFTRIAXONE  0.5 SENSITIVE Sensitive     CIPROFLOXACIN  <=0.06 SENSITIVE Sensitive     GENTAMICIN <=1 SENSITIVE Sensitive     MEROPENEM <=0.25 SENSITIVE Sensitive     TRIMETH/SULFA <=20 SENSITIVE Sensitive     AMPICILLIN/SULBACTAM >=32 RESISTANT Resistant     PIP/TAZO Value in next row Sensitive      16 SENSITIVEThis is a modified FDA-approved test that has been validated and its performance characteristics determined by the reporting laboratory.  This laboratory is certified under the Clinical Laboratory Improvement Amendments CLIA as qualified to perform high complexity clinical laboratory testing.    * ESCHERICHIA COLI  Resp panel by RT-PCR (RSV, Flu A&B, Covid)     Status: None   Collection Time: 02/23/24 11:09 AM   Specimen: Nasal Swab  Result Value Ref Range Status   SARS Coronavirus 2 by RT PCR NEGATIVE NEGATIVE Final    Comment: (NOTE) SARS-CoV-2 target nucleic acids are NOT DETECTED.  The SARS-CoV-2 RNA is generally detectable in upper respiratory specimens during the acute  phase of infection. The lowest concentration of SARS-CoV-2 viral copies this assay can detect is 138 copies/mL. A negative result does not preclude SARS-Cov-2 infection and should not be used as the sole basis for treatment or other patient management decisions. A negative result may occur with  improper specimen collection/handling, submission of specimen other than nasopharyngeal swab, presence of viral mutation(s) within the areas targeted by this assay, and inadequate number of viral copies(<138 copies/mL). A negative result must be combined with clinical observations, patient history, and epidemiological information. The expected result is Negative.  Fact Sheet for Patients:  BloggerCourse.com  Fact Sheet for Healthcare Providers:  SeriousBroker.it  This test is no t yet approved or cleared by the [place]  FDA and  has been authorized for detection and/or diagnosis of SARS-CoV-2 by FDA under an Emergency Use Authorization (EUA). This EUA will remain  in effect (meaning this test can be used) for the duration of the COVID-19 declaration under Section 564(b)(1) of the Act, 21 U.S.C.section 360bbb-3(b)(1), unless the authorization is terminated  or revoked sooner.       Influenza A by PCR NEGATIVE NEGATIVE Final   Influenza B by PCR NEGATIVE NEGATIVE Final    Comment: (NOTE) The Xpert Xpress SARS-CoV-2/FLU/RSV plus assay is intended as an aid in the diagnosis of influenza from Nasopharyngeal swab specimens and should not be used as a sole basis for treatment. Nasal washings and aspirates are unacceptable for Xpert Xpress SARS-CoV-2/FLU/RSV testing.  Fact Sheet for Patients: BloggerCourse.com  Fact Sheet for Healthcare Providers: SeriousBroker.it  This test is not yet approved or cleared by the [place]  FDA and has been authorized for detection and/or diagnosis of  SARS-CoV-2 by FDA under an Emergency Use Authorization (EUA). This EUA will remain in effect (meaning this test can be used) for the duration of the COVID-19 declaration under Section 564(b)(1) of the Act, 21 U.S.C. section 360bbb-3(b)(1), unless the authorization is terminated or revoked.     Resp Syncytial Virus by PCR NEGATIVE NEGATIVE Final    Comment: (NOTE) Fact Sheet for Patients: BloggerCourse.com  Fact Sheet for Healthcare Providers: SeriousBroker.it  This test is not yet approved or cleared by the [place]  FDA and has been authorized for detection and/or diagnosis of SARS-CoV-2 by FDA under an Emergency Use Authorization (EUA). This EUA will remain in effect (meaning  this test can be used) for the duration of the COVID-19 declaration under Section 564(b)(1) of the Act, 21 U.S.C. section 360bbb-3(b)(1), unless the authorization is terminated or revoked.  Performed at [facility], [address] [place], [place] [address]   Urine Culture     Status: Abnormal   Collection Time: 02/23/24 11:09 AM   Specimen: Urine, Random  Result Value Ref Range Status   Specimen Description   Final    URINE, RANDOM Performed at [facility], [address], [place], [place] [address]    Special Requests   Final    NONE Reflexed from [id number] Performed at [facility], [address] [place], [place] [address]    Culture >=100,000 COLONIES/mL ESCHERICHIA COLI (A)  Final   Report Status 02/25/2024 FINAL  Final   Organism ID, Bacteria ESCHERICHIA COLI (A)  Final      Susceptibility   Escherichia coli - MIC*    AMPICILLIN >=32 RESISTANT Resistant     CEFAZOLIN (URINE) Value in next row Resistant      >=32 RESISTANTThis is a modified FDA-approved test that has been validated and its performance characteristics determined by the reporting laboratory.  This laboratory is  certified under the Clinical Laboratory Improvement Amendments CLIA as qualified to perform high complexity clinical laboratory testing.    CEFEPIME Value in next row Sensitive      >=32 RESISTANTThis is a modified FDA-approved test that has been validated and its performance characteristics determined by the reporting laboratory.  This laboratory is certified under the Clinical Laboratory Improvement Amendments CLIA as qualified to perform high complexity clinical laboratory testing.    ERTAPENEM Value in next row Sensitive      >=32 RESISTANTThis is a modified FDA-approved test that has been validated and its performance characteristics determined by the reporting laboratory.  This laboratory is certified under the Clinical Laboratory Improvement Amendments CLIA as qualified to perform high complexity clinical laboratory testing.    CEFTRIAXONE  Value in next row Sensitive      >=32 RESISTANTThis is a modified FDA-approved test that has been validated and its performance characteristics determined by the reporting laboratory.  This laboratory is certified under the Clinical Laboratory Improvement Amendments CLIA as qualified to perform high complexity clinical laboratory testing.    CIPROFLOXACIN  Value in next row Sensitive      >=32 RESISTANTThis is a modified FDA-approved test that has been validated and its performance characteristics determined by the reporting laboratory.  This laboratory is certified under the Clinical Laboratory Improvement Amendments CLIA as qualified to perform high complexity clinical laboratory testing.    GENTAMICIN Value in next row Sensitive      >=32 RESISTANTThis is a modified FDA-approved test that has been validated and its performance characteristics determined by the reporting laboratory.  This laboratory is certified under the Clinical Laboratory Improvement Amendments CLIA as qualified to perform high complexity clinical laboratory testing.    NITROFURANTOIN Value  in next row Sensitive      >=32 RESISTANTThis is a modified FDA-approved test that has been validated and its performance characteristics determined by the reporting laboratory.  This laboratory is certified under the Clinical Laboratory Improvement Amendments CLIA as qualified to perform high complexity clinical laboratory testing.    TRIMETH/SULFA Value in next row Sensitive      >=32 RESISTANTThis is a modified FDA-approved test that has been validated and its performance characteristics determined by the reporting laboratory.  This laboratory is certified  under the Clinical Laboratory Improvement Amendments CLIA as qualified to perform high complexity clinical laboratory testing.    AMPICILLIN/SULBACTAM Value in next row Resistant      >=32 RESISTANTThis is a modified FDA-approved test that has been validated and its performance characteristics determined by the reporting laboratory.  This laboratory is certified under the Clinical Laboratory Improvement Amendments CLIA as qualified to perform high complexity clinical laboratory testing.    PIP/TAZO Value in next row Sensitive      8 SENSITIVEThis is a modified FDA-approved test that has been validated and its performance characteristics determined by the reporting laboratory.  This laboratory is certified under the Clinical Laboratory Improvement Amendments CLIA as qualified to perform high complexity clinical laboratory testing.    MEROPENEM Value in next row Sensitive      8 SENSITIVEThis is a modified FDA-approved test that has been validated and its performance characteristics determined by the reporting laboratory.  This laboratory is certified under the Clinical Laboratory Improvement Amendments CLIA as qualified to perform high complexity clinical laboratory testing.    * >=100,000 COLONIES/mL ESCHERICHIA COLI  Blood Culture ID Panel (Reflexed)     Status: Abnormal   Collection Time: 02/23/24 11:09 AM  Result Value Ref Range Status    Enterococcus faecalis NOT DETECTED NOT DETECTED Final   Enterococcus Faecium NOT DETECTED NOT DETECTED Final   Listeria monocytogenes NOT DETECTED NOT DETECTED Final   Staphylococcus species NOT DETECTED NOT DETECTED Final   Staphylococcus aureus (BCID) NOT DETECTED NOT DETECTED Final   Staphylococcus epidermidis NOT DETECTED NOT DETECTED Final   Staphylococcus lugdunensis NOT DETECTED NOT DETECTED Final   Streptococcus species NOT DETECTED NOT DETECTED Final   Streptococcus agalactiae NOT DETECTED NOT DETECTED Final   Streptococcus pneumoniae NOT DETECTED NOT DETECTED Final   Streptococcus pyogenes NOT DETECTED NOT DETECTED Final   A.calcoaceticus-baumannii NOT DETECTED NOT DETECTED Final   Bacteroides fragilis NOT DETECTED NOT DETECTED Final   Enterobacterales DETECTED (A) NOT DETECTED Final    Comment: Enterobacterales represent a large order of gram negative bacteria, not a single organism. CRITICAL RESULT CALLED TO, READ BACK BY AND VERIFIED WITH: PHARMD E JACKSON 02/24/2024 @ 0319 BY AB    Enterobacter cloacae complex NOT DETECTED NOT DETECTED Final   Escherichia coli DETECTED (A) NOT DETECTED Final    Comment: CRITICAL RESULT CALLED TO, READ BACK BY AND VERIFIED WITH: PHARMD E JACKSON 02/24/2024 @ 0319 BY AB    Klebsiella aerogenes NOT DETECTED NOT DETECTED Final   Klebsiella oxytoca NOT DETECTED NOT DETECTED Final   Klebsiella pneumoniae NOT DETECTED NOT DETECTED Final   Proteus species NOT DETECTED NOT DETECTED Final   Salmonella species NOT DETECTED NOT DETECTED Final   Serratia marcescens NOT DETECTED NOT DETECTED Final   Haemophilus influenzae NOT DETECTED NOT DETECTED Final   Neisseria meningitidis NOT DETECTED NOT DETECTED Final   Pseudomonas aeruginosa NOT DETECTED NOT DETECTED Final   Stenotrophomonas maltophilia NOT DETECTED NOT DETECTED Final   Candida albicans NOT DETECTED NOT DETECTED Final   Candida auris NOT DETECTED NOT DETECTED Final   Candida glabrata NOT  DETECTED NOT DETECTED Final   Candida krusei NOT DETECTED NOT DETECTED Final   Candida parapsilosis NOT DETECTED NOT DETECTED Final   Candida tropicalis NOT DETECTED NOT DETECTED Final   Cryptococcus neoformans/gattii NOT DETECTED NOT DETECTED Final   CTX-M ESBL NOT DETECTED NOT DETECTED Final   Carbapenem resistance IMP NOT DETECTED NOT DETECTED Final   Carbapenem resistance KPC NOT DETECTED NOT DETECTED  Final   Carbapenem resistance NDM NOT DETECTED NOT DETECTED Final   Carbapenem resist OXA 48 LIKE NOT DETECTED NOT DETECTED Final   Carbapenem resistance VIM NOT DETECTED NOT DETECTED Final    Comment: Performed at [facility], [address], [place], [place] [address]  Culture, blood (Routine X 2) w Reflex to ID Panel     Status: None (Preliminary result)   Collection Time: 02/25/24 11:44 AM   Specimen: BLOOD RIGHT HAND  Result Value Ref Range Status   Specimen Description   Final    BLOOD RIGHT HAND Performed at [facility], [address], [place], [place] [address]    Special Requests   Final    BOTTLES DRAWN AEROBIC AND ANAEROBIC Blood Culture adequate volume Performed at [facility], [address], [place], [place] [address]    Culture   Final    NO GROWTH < 24 HOURS Performed at [facility], [address], [place], [place] [address]    Report Status PENDING  Incomplete  Culture, blood (Routine X 2) w Reflex to ID Panel     Status: None (Preliminary result)   Collection Time: 02/25/24 11:44 AM   Specimen: BLOOD RIGHT ARM  Result Value Ref Range Status   Specimen Description   Final    BLOOD RIGHT ARM Performed at [facility], [address], [place], [place] [address]    Special Requests   Final    BOTTLES DRAWN AEROBIC AND ANAEROBIC Blood Culture results may not be optimal due to an inadequate volume of blood received in culture bottles Performed at [facility], [address], [place], [place]  [address]    Culture  Setup Time   Final    GRAM POSITIVE COCCI IN CLUSTERS ANAEROBIC BOTTLE ONLY CRITICAL RESULT CALLED TO, READ BACK BY AND VERIFIED WITH: PHARMD M.STWYNE AT 1132 ON 02/26/2024 BY T.SAAD. Performed at [facility], [address], [place], [place] [address]    Culture GRAM POSITIVE COCCI  Final   Report Status PENDING  Incomplete  Blood Culture ID Panel (Reflexed)     Status: Abnormal   Collection Time: 02/25/24 11:44 AM  Result Value Ref Range Status   Enterococcus faecalis NOT DETECTED NOT DETECTED Final   Enterococcus Faecium NOT DETECTED NOT DETECTED Final   Listeria monocytogenes NOT DETECTED NOT DETECTED Final   Staphylococcus species DETECTED (A) NOT DETECTED Final    Comment: CRITICAL RESULT CALLED TO, READ BACK BY AND VERIFIED WITH: PHARMD M.STWYNE AT 1132 ON 02/26/2024 BY T.SAAD.    Staphylococcus aureus (BCID) NOT DETECTED NOT DETECTED Final   Staphylococcus epidermidis NOT DETECTED NOT DETECTED Final   Staphylococcus lugdunensis NOT DETECTED NOT DETECTED Final   Streptococcus species NOT DETECTED NOT DETECTED Final   Streptococcus agalactiae NOT DETECTED NOT DETECTED Final   Streptococcus pneumoniae NOT DETECTED NOT DETECTED Final   Streptococcus pyogenes NOT DETECTED NOT DETECTED Final   A.calcoaceticus-baumannii NOT DETECTED NOT DETECTED Final   Bacteroides fragilis NOT DETECTED NOT DETECTED Final   Enterobacterales NOT DETECTED NOT DETECTED Final   Enterobacter cloacae complex NOT DETECTED NOT DETECTED Final   Escherichia coli NOT DETECTED NOT DETECTED Final   Klebsiella aerogenes NOT DETECTED NOT DETECTED Final   Klebsiella oxytoca NOT DETECTED NOT DETECTED Final   Klebsiella pneumoniae NOT DETECTED NOT DETECTED Final   Proteus species NOT DETECTED NOT DETECTED Final   Salmonella species NOT DETECTED NOT DETECTED Final  Serratia marcescens NOT DETECTED NOT DETECTED Final   Haemophilus influenzae NOT DETECTED NOT DETECTED Final   Neisseria  meningitidis NOT DETECTED NOT DETECTED Final   Pseudomonas aeruginosa NOT DETECTED NOT DETECTED Final   Stenotrophomonas maltophilia NOT DETECTED NOT DETECTED Final   Candida albicans NOT DETECTED NOT DETECTED Final   Candida auris NOT DETECTED NOT DETECTED Final   Candida glabrata NOT DETECTED NOT DETECTED Final   Candida krusei NOT DETECTED NOT DETECTED Final   Candida parapsilosis NOT DETECTED NOT DETECTED Final   Candida tropicalis NOT DETECTED NOT DETECTED Final   Cryptococcus neoformans/gattii NOT DETECTED NOT DETECTED Final    Comment: Performed at [facility], [address] [place], [place] [address]         Radiology Studies: No results found.       Scheduled Meds:  ciprofloxacin   750 mg Oral BID   cyanocobalamin   1,000 mcg Oral Daily   enoxaparin  (LOVENOX ) injection  80 mg Subcutaneous Q24H   levothyroxine   75 mcg Oral QAC breakfast   melatonin  3 mg Oral QHS   Oxcarbazepine   300 mg Oral BID   sertraline   100 mg Oral Daily   tamsulosin   0.4 mg Oral Daily   Continuous Infusions:  lactated ringers  75 mL/hr at 02/25/24 1837     LOS: 3 days    Time spent: 35 minutes    Tamotsu Wiederholt A Olivia Pavelko, MD [facility]   If 7PM-7AM, please contact night-coverage [email or web address]  02/26/2024, 2:41 PM

## 2024-02-26 NOTE — Progress Notes (Signed)
 PHARMACY - PHYSICIAN COMMUNICATION CRITICAL VALUE ALERT - BLOOD CULTURE IDENTIFICATION (BCID)  Shawn Mercado is an 38 y.o. male who presented to [facility] on 02/23/2024 with a chief complaint of urinary incontinence, weakness  Assessment:   9/22 bcx 1/4 staph species, likely contaminant  Pt with known E.coli bacteremia in setting of UTI 9/20 bcx 2/4 E.coli 9/20 ucx 100k E.coli  Name of physician (or Provider) Contacted: Dr. Madelyne  Current antibiotics: ceftriaxone   Changes to prescribed antibiotics recommended: none  Results for orders placed or performed during the hospital encounter of 02/23/24  Blood Culture ID Panel (Reflexed) (Collected: 02/25/2024 11:44 AM)  Result Value Ref Range   Enterococcus faecalis NOT DETECTED NOT DETECTED   Enterococcus Faecium NOT DETECTED NOT DETECTED   Listeria monocytogenes NOT DETECTED NOT DETECTED   Staphylococcus species DETECTED (A) NOT DETECTED   Staphylococcus aureus (BCID) NOT DETECTED NOT DETECTED   Staphylococcus epidermidis NOT DETECTED NOT DETECTED   Staphylococcus lugdunensis NOT DETECTED NOT DETECTED   Streptococcus species NOT DETECTED NOT DETECTED   Streptococcus agalactiae NOT DETECTED NOT DETECTED   Streptococcus pneumoniae NOT DETECTED NOT DETECTED   Streptococcus pyogenes NOT DETECTED NOT DETECTED   A.calcoaceticus-baumannii NOT DETECTED NOT DETECTED   Bacteroides fragilis NOT DETECTED NOT DETECTED   Enterobacterales NOT DETECTED NOT DETECTED   Enterobacter cloacae complex NOT DETECTED NOT DETECTED   Escherichia coli NOT DETECTED NOT DETECTED   Klebsiella aerogenes NOT DETECTED NOT DETECTED   Klebsiella oxytoca NOT DETECTED NOT DETECTED   Klebsiella pneumoniae NOT DETECTED NOT DETECTED   Proteus species NOT DETECTED NOT DETECTED   Salmonella species NOT DETECTED NOT DETECTED   Serratia marcescens NOT DETECTED NOT DETECTED   Haemophilus influenzae NOT DETECTED NOT DETECTED   Neisseria meningitidis NOT DETECTED NOT  DETECTED   Pseudomonas aeruginosa NOT DETECTED NOT DETECTED   Stenotrophomonas maltophilia NOT DETECTED NOT DETECTED   Candida albicans NOT DETECTED NOT DETECTED   Candida auris NOT DETECTED NOT DETECTED   Candida glabrata NOT DETECTED NOT DETECTED   Candida krusei NOT DETECTED NOT DETECTED   Candida parapsilosis NOT DETECTED NOT DETECTED   Candida tropicalis NOT DETECTED NOT DETECTED   Cryptococcus neoformans/gattii NOT DETECTED NOT DETECTED   Stefano MARLA Bologna, PharmD, BCPS Clinical Pharmacist 02/26/2024 12:06 PM

## 2024-02-27 ENCOUNTER — Other Ambulatory Visit (HOSPITAL_COMMUNITY): Payer: Self-pay

## 2024-02-27 DIAGNOSIS — N390 Urinary tract infection, site not specified: Secondary | ICD-10-CM | POA: Diagnosis not present

## 2024-02-27 DIAGNOSIS — A419 Sepsis, unspecified organism: Secondary | ICD-10-CM | POA: Diagnosis not present

## 2024-02-27 LAB — BASIC METABOLIC PANEL WITH GFR
Anion gap: 13 (ref 5–15)
BUN: 19 mg/dL (ref 6–20)
CO2: 23 mmol/L (ref 22–32)
Calcium: 8.9 mg/dL (ref 8.9–10.3)
Chloride: 102 mmol/L (ref 98–111)
Creatinine, Ser: 0.87 mg/dL (ref 0.61–1.24)
GFR, Estimated: >60 mL/min (ref >60)
Glucose, Bld: 97 mg/dL (ref 70–99)
Potassium: 3.8 mmol/L (ref 3.5–5.1)
Sodium: 138 mmol/L (ref 135–145)

## 2024-02-27 LAB — CBC
HCT: 43.2 % (ref 39.0–52.0)
Hemoglobin: 14.1 g/dL (ref 13.0–17.0)
MCH: 29.8 pg (ref 26.0–34.0)
MCHC: 32.6 g/dL (ref 30.0–36.0)
MCV: 91.3 fL (ref 80.0–100.0)
Platelets: 170 K/uL (ref 150–400)
RBC: 4.73 MIL/uL (ref 4.22–5.81)
RDW: 12.7 % (ref 11.5–15.5)
WBC: 4.6 K/uL (ref 4.0–10.5)
nRBC: 0 % (ref 0.0–0.2)

## 2024-02-27 MED ORDER — CIPROFLOXACIN HCL 750 MG PO TABS
750.0000 mg | ORAL_TABLET | Freq: Two times a day (BID) | ORAL | 0 refills | Status: AC
  Filled 2024-02-27: qty 14, 7d supply, fill #0

## 2024-02-27 NOTE — Plan of Care (Signed)
  Problem: Clinical Measurements: Goal: Signs and symptoms of infection will decrease Outcome: Progressing   Problem: Pain Managment: Goal: General experience of comfort will improve and/or be controlled Outcome: Progressing   Problem: Safety: Goal: Ability to remain free from injury will improve Outcome: Progressing

## 2024-02-27 NOTE — Discharge Summary (Signed)
 Physician Discharge Summary   Patient: Shawn Mercado MRN: [id number] DOB: 01-Feb-1986  Admit date:     02/23/2024  Discharge date: 02/27/24  Discharge Physician: Delon Herald   PCP: Piva, Enrico E, DO   Recommendations at discharge:   Complete antibiotics - Ciprofloxacin  twice daily until gone Follow up with Dr. Piva in 1-2 weeks  Discharge Diagnoses: Principal Problem:   Sepsis secondary to UTI [facility]) Active Problems:   Class 3 obesity   Depression   PTSD (post-traumatic stress disorder)   Hypothyroidism   Hyperbilirubinemia    Hospital Course: 38yo with h/o hypothyroidism, MS, TBI, morbid obesity, and PTSD who presented on 9/20 with urinary symptoms.  He was found to have sepsis due to UTI with E coli bacteremia.  He was treated with Ceftriaxone  -> Ciprofloxacin .  Assessment and Plan:  Sepsis secondary to urinary tract infection with E. coli bacteremia Presented with fever, tachypnea, leukocytosis, source of infection urinary tract infection UA with positive nitrates and more than 50 white blood cells Blood cultures + E coli Urine culture + E coli Treated with IV ceftriaxone  Repeated blood cultures due to low grade fever - growing staph hominis, likely contaminate WBC down to 8 He has been transition to Cipro  to complete 10 days of total antibiotics    1/4 Blood culture positive for Staph hominis Likely contaminate Will not change treatment   Morbid/class 3 obesity Body mass index is 56.7 kg/m.SABRA  Weight loss should be encouraged Outpatient PCP/bariatric medicine f/u encouraged Significantly low or high BMI is associated with higher medical risk including morbidity and mortality    Depression/Traumatic brain injury/PTSD Continue sertraline , Ocrelizumab, and oxcarbazepine    Hypothyroidism Continue Synthroid   BPH Continue tamsulosin      Consultants: TOC team  Procedures: None  Antibiotics: Ceftriaxone  x 1 Cipro  9/23-10/1  30 Day Unplanned  Readmission Risk Score    Flowsheet Row ED to Hosp-Admission (Current) from 02/23/2024 in [facility]  30 Day Unplanned Readmission Risk Score (%) 8.62 Filed at 02/27/2024 0400    This score is the patient's risk of an unplanned readmission within 30 days of being discharged (0 -100%). The score is based on dignosis, age, lab data, medications, orders, and past utilization.   Low:  0-14.9   Medium: 15-21.9   High: 22-29.9   Extreme: 30 and above          Pain control - [place]  Controlled Substance Reporting System database was reviewed. and patient was instructed, not to drive, operate heavy machinery, perform activities at heights, swimming or participation in water activities or provide baby-sitting services while on Pain, Sleep and Anxiety Medications; until their outpatient Physician has advised to do so again. Also recommended to not to take more than prescribed Pain, Sleep and Anxiety Medications.    Disposition: Home, has live-in caregiver Diet recommendation:  Regular diet DISCHARGE MEDICATION: Allergies as of 02/27/2024       Reactions   Copaxone [glatiramer Acetate] Anaphylaxis        Medication List     TAKE these medications    Cholecalciferol 50 MCG (2000 UT) Tabs Take 2 tablets by mouth daily.   ciprofloxacin  750 MG tablet Commonly known as: CIPRO  Take 1 tablet (750 mg total) by mouth 2 (two) times daily for 7 days.   levothyroxine  75 MCG tablet Commonly known as: SYNTHROID  Take 75 mcg by mouth daily before breakfast.   melatonin 3 MG Tabs tablet Take 3 mg by mouth at bedtime.  ocrelizumab 600 mg in sodium chloride  0.9 % Inject 600 mg into the vein See admin instructions. Inject 600mg  intravenously once every 6 months.   Oxcarbazepine  300 MG tablet Commonly known as: TRILEPTAL  Take 300 mg by mouth 2 (two) times daily.   sertraline  100 MG tablet Commonly known as: ZOLOFT  Take 100 mg by mouth daily.    tamsulosin  0.4 MG Caps capsule Commonly known as: FLOMAX  Take 0.4 mg by mouth daily.   vitamin B-12 500 MCG tablet Commonly known as: CYANOCOBALAMIN  Take 1,000 mcg by mouth daily.        Discharge Exam:    Subjective: Feels back to baseline without new concerns.  Ready to go home.   Objective: Vitals:   02/26/24 2023 02/27/24 0547  BP: (!) 151/97 129/89  Pulse: 76 62  Resp: 20 20  Temp: 98.9 F (37.2 C) 98 F (36.7 C)  SpO2: 96% 95%    Intake/Output Summary (Last 24 hours) at 02/27/2024 1307 Last data filed at 02/27/2024 0800 Gross per 24 hour  Intake 240 ml  Output 650 ml  Net -410 ml   Filed Weights   02/23/24 1135 02/23/24 1652  Weight: (!) 166.4 kg (!) 164.2 kg    Exam:  General:  Appears calm and comfortable and is in NAD Eyes:  normal lids, iris ENT:  grossly normal hearing, lips & tongue, mmm Cardiovascular:  RRR. No LE edema.  Respiratory:   CTA bilaterally with no wheezes/rales/rhonchi.  Normal respiratory effort. Abdomen:  soft, NT, ND Skin:  no rash or induration seen on limited exam Musculoskeletal:  grossly normal tone BUE/BLE, good ROM, no bony abnormality Psychiatric:  grossly normal mood and affect, speech fluent and appropriate, AOx3 Neurologic:  CN 2-12 grossly intact, moves all extremities in coordinated fashion  Data Reviewed: I have reviewed the patient's lab results since admission.  Pertinent labs for today include:   Normal CBC Normal BMP Blood culture x 1 positive for Staph hominis    Condition at discharge: stable  The results of significant diagnostics from this hospitalization (including imaging, microbiology, ancillary and laboratory) are listed below for reference.   Imaging Studies: DG Chest Port 1 View Result Date: 02/23/2024 CLINICAL DATA:  Questionable sepsis. Clemens out of bed. Urinary incontinence beginning last night. EXAM: PORTABLE CHEST 1 VIEW COMPARISON:  06/17/2016 FINDINGS: Lordotic technique is  demonstrated. Lungs are adequately inflated without focal airspace consolidation or effusion. Cardiomediastinal silhouette, bones and soft tissues are unchanged. IMPRESSION: No active disease. Electronically Signed   By: Toribio Agreste M.D.   On: 02/23/2024 12:58    Microbiology: Results for orders placed or performed during the hospital encounter of 02/23/24  Blood Culture (routine x 2)     Status: None (Preliminary result)   Collection Time: 02/23/24 11:04 AM   Specimen: BLOOD  Result Value Ref Range Status   Specimen Description   Final    BLOOD RIGHT ANTECUBITAL Performed at [facility], [address], [place], [place] [address]    Special Requests   Final    BOTTLES DRAWN AEROBIC AND ANAEROBIC Blood Culture results may not be optimal due to an inadequate volume of blood received in culture bottles Performed at [facility], [address], [place], [place] [address]    Culture   Final    NO GROWTH 4 DAYS Performed at [facility], [address] [place], [place] [address]    Report Status PENDING  Incomplete  Blood Culture (routine x  2)     Status: Abnormal   Collection Time: 02/23/24 11:09 AM   Specimen: BLOOD  Result Value Ref Range Status   Specimen Description   Final    BLOOD LEFT ANTECUBITAL Performed at [facility], [address] [place] [address], [place], [place] [address]    Special Requests   Final    BOTTLES DRAWN AEROBIC AND ANAEROBIC Blood Culture results may not be optimal due to an inadequate volume of blood received in culture bottles Performed at [facility], [address] [place], [place] [address]    Culture  Setup Time   Final    GRAM NEGATIVE RODS IN BOTH AEROBIC AND ANAEROBIC BOTTLES CRITICAL RESULT CALLED TO, READ BACK BY AND VERIFIED WITH: PHARMD E JACKSON 02/24/2024 @ 0319 BY AB Performed at [facility], [address] [place], [place] [address]    Culture ESCHERICHIA  COLI (A)  Final   Report Status 02/26/2024 FINAL  Final   Organism ID, Bacteria ESCHERICHIA COLI  Final      Susceptibility   Escherichia coli - MIC*    AMPICILLIN >=32 RESISTANT Resistant     CEFAZOLIN (NON-URINE) 16 RESISTANT Resistant     CEFEPIME <=0.12 SENSITIVE Sensitive     ERTAPENEM <=0.12 SENSITIVE Sensitive     CEFTRIAXONE  0.5 SENSITIVE Sensitive     CIPROFLOXACIN  <=0.06 SENSITIVE Sensitive     GENTAMICIN <=1 SENSITIVE Sensitive     MEROPENEM <=0.25 SENSITIVE Sensitive     TRIMETH/SULFA <=20 SENSITIVE Sensitive     AMPICILLIN/SULBACTAM >=32 RESISTANT Resistant     PIP/TAZO Value in next row Sensitive      16 SENSITIVEThis is a modified FDA-approved test that has been validated and its performance characteristics determined by the reporting laboratory.  This laboratory is certified under the Clinical Laboratory Improvement Amendments CLIA as qualified to perform high complexity clinical laboratory testing.    * ESCHERICHIA COLI  Resp panel by RT-PCR (RSV, Flu A&B, Covid)     Status: None   Collection Time: 02/23/24 11:09 AM   Specimen: Nasal Swab  Result Value Ref Range Status   SARS Coronavirus 2 by RT PCR NEGATIVE NEGATIVE Final    Comment: (NOTE) SARS-CoV-2 target nucleic acids are NOT DETECTED.  The SARS-CoV-2 RNA is generally detectable in upper respiratory specimens during the acute phase of infection. The lowest concentration of SARS-CoV-2 viral copies this assay can detect is 138 copies/mL. A negative result does not preclude SARS-Cov-2 infection and should not be used as the sole basis for treatment or other patient management decisions. A negative result may occur with  improper specimen collection/handling, submission of specimen other than nasopharyngeal swab, presence of viral mutation(s) within the areas targeted by this assay, and inadequate number of viral copies(<138 copies/mL). A negative result must be combined with clinical observations, patient  history, and epidemiological information. The expected result is Negative.  Fact Sheet for Patients:  BloggerCourse.com  Fact Sheet for Healthcare Providers:  SeriousBroker.it  This test is no t yet approved or cleared by the [place]  FDA and  has been authorized for detection and/or diagnosis of SARS-CoV-2 by FDA under an Emergency Use Authorization (EUA). This EUA will remain  in effect (meaning this test can be used) for the duration of the COVID-19 declaration under Section 564(b)(1) of the Act, 21 U.S.C.section 360bbb-3(b)(1), unless the authorization is terminated  or revoked sooner.       Influenza A by PCR NEGATIVE NEGATIVE Final   Influenza  B by PCR NEGATIVE NEGATIVE Final    Comment: (NOTE) The Xpert Xpress SARS-CoV-2/FLU/RSV plus assay is intended as an aid in the diagnosis of influenza from Nasopharyngeal swab specimens and should not be used as a sole basis for treatment. Nasal washings and aspirates are unacceptable for Xpert Xpress SARS-CoV-2/FLU/RSV testing.  Fact Sheet for Patients: BloggerCourse.com  Fact Sheet for Healthcare Providers: SeriousBroker.it  This test is not yet approved or cleared by the [place]  FDA and has been authorized for detection and/or diagnosis of SARS-CoV-2 by FDA under an Emergency Use Authorization (EUA). This EUA will remain in effect (meaning this test can be used) for the duration of the COVID-19 declaration under Section 564(b)(1) of the Act, 21 U.S.C. section 360bbb-3(b)(1), unless the authorization is terminated or revoked.     Resp Syncytial Virus by PCR NEGATIVE NEGATIVE Final    Comment: (NOTE) Fact Sheet for Patients: BloggerCourse.com  Fact Sheet for Healthcare Providers: SeriousBroker.it  This test is not yet approved or cleared by the [place]  FDA  and has been authorized for detection and/or diagnosis of SARS-CoV-2 by FDA under an Emergency Use Authorization (EUA). This EUA will remain in effect (meaning this test can be used) for the duration of the COVID-19 declaration under Section 564(b)(1) of the Act, 21 U.S.C. section 360bbb-3(b)(1), unless the authorization is terminated or revoked.  Performed at [facility], [address] [place], [place] [address]   Urine Culture     Status: Abnormal   Collection Time: 02/23/24 11:09 AM   Specimen: Urine, Random  Result Value Ref Range Status   Specimen Description   Final    URINE, RANDOM Performed at [facility], [address], [place], [place] [address]    Special Requests   Final    NONE Reflexed from [id number] Performed at [facility], [address], [place], [place] [address]    Culture >=100,000 COLONIES/mL ESCHERICHIA COLI (A)  Final   Report Status 02/25/2024 FINAL  Final   Organism ID, Bacteria ESCHERICHIA COLI (A)  Final      Susceptibility   Escherichia coli - MIC*    AMPICILLIN >=32 RESISTANT Resistant     CEFAZOLIN (URINE) Value in next row Resistant      >=32 RESISTANTThis is a modified FDA-approved test that has been validated and its performance characteristics determined by the reporting laboratory.  This laboratory is certified under the Clinical Laboratory Improvement Amendments CLIA as qualified to perform high complexity clinical laboratory testing.    CEFEPIME Value in next row Sensitive      >=32 RESISTANTThis is a modified FDA-approved test that has been validated and its performance characteristics determined by the reporting laboratory.  This laboratory is certified under the Clinical Laboratory Improvement Amendments CLIA as qualified to perform high complexity clinical laboratory testing.    ERTAPENEM Value in next row Sensitive      >=32 RESISTANTThis is a modified FDA-approved test that has  been validated and its performance characteristics determined by the reporting laboratory.  This laboratory is certified under the Clinical Laboratory Improvement Amendments CLIA as qualified to perform high complexity clinical laboratory testing.    CEFTRIAXONE  Value in next row Sensitive      >=32 RESISTANTThis is a modified FDA-approved test that has been validated and its performance characteristics determined by the reporting laboratory.  This laboratory is certified under the Clinical Laboratory Improvement Amendments CLIA as qualified to perform high complexity clinical laboratory testing.  CIPROFLOXACIN  Value in next row Sensitive      >=32 RESISTANTThis is a modified FDA-approved test that has been validated and its performance characteristics determined by the reporting laboratory.  This laboratory is certified under the Clinical Laboratory Improvement Amendments CLIA as qualified to perform high complexity clinical laboratory testing.    GENTAMICIN Value in next row Sensitive      >=32 RESISTANTThis is a modified FDA-approved test that has been validated and its performance characteristics determined by the reporting laboratory.  This laboratory is certified under the Clinical Laboratory Improvement Amendments CLIA as qualified to perform high complexity clinical laboratory testing.    NITROFURANTOIN Value in next row Sensitive      >=32 RESISTANTThis is a modified FDA-approved test that has been validated and its performance characteristics determined by the reporting laboratory.  This laboratory is certified under the Clinical Laboratory Improvement Amendments CLIA as qualified to perform high complexity clinical laboratory testing.    TRIMETH/SULFA Value in next row Sensitive      >=32 RESISTANTThis is a modified FDA-approved test that has been validated and its performance characteristics determined by the reporting laboratory.  This laboratory is certified under the Clinical Laboratory  Improvement Amendments CLIA as qualified to perform high complexity clinical laboratory testing.    AMPICILLIN/SULBACTAM Value in next row Resistant      >=32 RESISTANTThis is a modified FDA-approved test that has been validated and its performance characteristics determined by the reporting laboratory.  This laboratory is certified under the Clinical Laboratory Improvement Amendments CLIA as qualified to perform high complexity clinical laboratory testing.    PIP/TAZO Value in next row Sensitive      8 SENSITIVEThis is a modified FDA-approved test that has been validated and its performance characteristics determined by the reporting laboratory.  This laboratory is certified under the Clinical Laboratory Improvement Amendments CLIA as qualified to perform high complexity clinical laboratory testing.    MEROPENEM Value in next row Sensitive      8 SENSITIVEThis is a modified FDA-approved test that has been validated and its performance characteristics determined by the reporting laboratory.  This laboratory is certified under the Clinical Laboratory Improvement Amendments CLIA as qualified to perform high complexity clinical laboratory testing.    * >=100,000 COLONIES/mL ESCHERICHIA COLI  Blood Culture ID Panel (Reflexed)     Status: Abnormal   Collection Time: 02/23/24 11:09 AM  Result Value Ref Range Status   Enterococcus faecalis NOT DETECTED NOT DETECTED Final   Enterococcus Faecium NOT DETECTED NOT DETECTED Final   Listeria monocytogenes NOT DETECTED NOT DETECTED Final   Staphylococcus species NOT DETECTED NOT DETECTED Final   Staphylococcus aureus (BCID) NOT DETECTED NOT DETECTED Final   Staphylococcus epidermidis NOT DETECTED NOT DETECTED Final   Staphylococcus lugdunensis NOT DETECTED NOT DETECTED Final   Streptococcus species NOT DETECTED NOT DETECTED Final   Streptococcus agalactiae NOT DETECTED NOT DETECTED Final   Streptococcus pneumoniae NOT DETECTED NOT DETECTED Final    Streptococcus pyogenes NOT DETECTED NOT DETECTED Final   A.calcoaceticus-baumannii NOT DETECTED NOT DETECTED Final   Bacteroides fragilis NOT DETECTED NOT DETECTED Final   Enterobacterales DETECTED (A) NOT DETECTED Final    Comment: Enterobacterales represent a large order of gram negative bacteria, not a single organism. CRITICAL RESULT CALLED TO, READ BACK BY AND VERIFIED WITH: PHARMD E JACKSON 02/24/2024 @ 0319 BY AB    Enterobacter cloacae complex NOT DETECTED NOT DETECTED Final   Escherichia coli DETECTED (A) NOT DETECTED Final  Comment: CRITICAL RESULT CALLED TO, READ BACK BY AND VERIFIED WITH: PHARMD E JACKSON 02/24/2024 @ 0319 BY AB    Klebsiella aerogenes NOT DETECTED NOT DETECTED Final   Klebsiella oxytoca NOT DETECTED NOT DETECTED Final   Klebsiella pneumoniae NOT DETECTED NOT DETECTED Final   Proteus species NOT DETECTED NOT DETECTED Final   Salmonella species NOT DETECTED NOT DETECTED Final   Serratia marcescens NOT DETECTED NOT DETECTED Final   Haemophilus influenzae NOT DETECTED NOT DETECTED Final   Neisseria meningitidis NOT DETECTED NOT DETECTED Final   Pseudomonas aeruginosa NOT DETECTED NOT DETECTED Final   Stenotrophomonas maltophilia NOT DETECTED NOT DETECTED Final   Candida albicans NOT DETECTED NOT DETECTED Final   Candida auris NOT DETECTED NOT DETECTED Final   Candida glabrata NOT DETECTED NOT DETECTED Final   Candida krusei NOT DETECTED NOT DETECTED Final   Candida parapsilosis NOT DETECTED NOT DETECTED Final   Candida tropicalis NOT DETECTED NOT DETECTED Final   Cryptococcus neoformans/gattii NOT DETECTED NOT DETECTED Final   CTX-M ESBL NOT DETECTED NOT DETECTED Final   Carbapenem resistance IMP NOT DETECTED NOT DETECTED Final   Carbapenem resistance KPC NOT DETECTED NOT DETECTED Final   Carbapenem resistance NDM NOT DETECTED NOT DETECTED Final   Carbapenem resist OXA 48 LIKE NOT DETECTED NOT DETECTED Final   Carbapenem resistance VIM NOT DETECTED NOT  DETECTED Final    Comment: Performed at [facility], [address], [place], [place] [address]  Culture, blood (Routine X 2) w Reflex to ID Panel     Status: None (Preliminary result)   Collection Time: 02/25/24 11:44 AM   Specimen: BLOOD RIGHT HAND  Result Value Ref Range Status   Specimen Description   Final    BLOOD RIGHT HAND Performed at [facility], [address], [place], [place] [address]    Special Requests   Final    BOTTLES DRAWN AEROBIC AND ANAEROBIC Blood Culture adequate volume Performed at [facility], [address], [place], [place] [address]    Culture   Final    NO GROWTH 2 DAYS Performed at [facility], [address], [place], [place] [address]    Report Status PENDING  Incomplete  Culture, blood (Routine X 2) w Reflex to ID Panel     Status: None (Preliminary result)   Collection Time: 02/25/24 11:44 AM   Specimen: BLOOD RIGHT ARM  Result Value Ref Range Status   Specimen Description   Final    BLOOD RIGHT ARM Performed at [facility], [address], [place], [place] [address]    Special Requests   Final    BOTTLES DRAWN AEROBIC AND ANAEROBIC Blood Culture results may not be optimal due to an inadequate volume of blood received in culture bottles Performed at [facility], [address], [place], [place] [address]    Culture  Setup Time   Final    GRAM POSITIVE COCCI IN CLUSTERS ANAEROBIC BOTTLE ONLY CRITICAL RESULT CALLED TO, READ BACK BY AND VERIFIED WITH: PHARMD M.STWYNE AT 1132 ON 02/26/2024 BY T.SAAD. Performed at [facility], [address], [place], [place] [address]    Culture GRAM POSITIVE COCCI STAPHYLOCOCCUS HOMINIS   Final   Report Status PENDING  Incomplete  Blood Culture ID Panel (Reflexed)     Status: Abnormal   Collection Time: 02/25/24 11:44 AM  Result Value Ref Range Status   Enterococcus faecalis NOT DETECTED NOT DETECTED Final  Enterococcus Faecium NOT  DETECTED NOT DETECTED Final   Listeria monocytogenes NOT DETECTED NOT DETECTED Final   Staphylococcus species DETECTED (A) NOT DETECTED Final    Comment: CRITICAL RESULT CALLED TO, READ BACK BY AND VERIFIED WITH: PHARMD M.STWYNE AT 1132 ON 02/26/2024 BY T.SAAD.    Staphylococcus aureus (BCID) NOT DETECTED NOT DETECTED Final   Staphylococcus epidermidis NOT DETECTED NOT DETECTED Final   Staphylococcus lugdunensis NOT DETECTED NOT DETECTED Final   Streptococcus species NOT DETECTED NOT DETECTED Final   Streptococcus agalactiae NOT DETECTED NOT DETECTED Final   Streptococcus pneumoniae NOT DETECTED NOT DETECTED Final   Streptococcus pyogenes NOT DETECTED NOT DETECTED Final   A.calcoaceticus-baumannii NOT DETECTED NOT DETECTED Final   Bacteroides fragilis NOT DETECTED NOT DETECTED Final   Enterobacterales NOT DETECTED NOT DETECTED Final   Enterobacter cloacae complex NOT DETECTED NOT DETECTED Final   Escherichia coli NOT DETECTED NOT DETECTED Final   Klebsiella aerogenes NOT DETECTED NOT DETECTED Final   Klebsiella oxytoca NOT DETECTED NOT DETECTED Final   Klebsiella pneumoniae NOT DETECTED NOT DETECTED Final   Proteus species NOT DETECTED NOT DETECTED Final   Salmonella species NOT DETECTED NOT DETECTED Final   Serratia marcescens NOT DETECTED NOT DETECTED Final   Haemophilus influenzae NOT DETECTED NOT DETECTED Final   Neisseria meningitidis NOT DETECTED NOT DETECTED Final   Pseudomonas aeruginosa NOT DETECTED NOT DETECTED Final   Stenotrophomonas maltophilia NOT DETECTED NOT DETECTED Final   Candida albicans NOT DETECTED NOT DETECTED Final   Candida auris NOT DETECTED NOT DETECTED Final   Candida glabrata NOT DETECTED NOT DETECTED Final   Candida krusei NOT DETECTED NOT DETECTED Final   Candida parapsilosis NOT DETECTED NOT DETECTED Final   Candida tropicalis NOT DETECTED NOT DETECTED Final   Cryptococcus neoformans/gattii NOT DETECTED NOT DETECTED Final    Comment: Performed at  [facility], [address], [place], [place] [address]    Labs: CBC: Recent Labs  Lab 02/23/24 1109 02/24/24 0617 02/25/24 0830 02/27/24 0829  WBC 17.5* 14.2* 8.1 4.6  NEUTROABS 15.8*  --   --   --   HGB 15.9 14.1 14.4 14.1  HCT 46.5 43.2 43.4 43.2  MCV 89.8 91.1 90.6 91.3  PLT 188 165 152 170   Basic Metabolic Panel: Recent Labs  Lab 02/23/24 1109 02/24/24 0617 02/25/24 0830 02/26/24 0539 02/27/24 0829  NA 137 138 138 139 138  K 3.8 3.5 3.7 3.4* 3.8  CL 99 102 101 101 102  CO2 22 22 22 24 23   GLUCOSE 107* 97 135* 90 97  BUN 16 13 14 14 19   CREATININE 0.95 0.85 0.94 0.87 0.87  CALCIUM 9.4 8.7* 8.6* 9.0 8.9   Liver Function Tests: Recent Labs  Lab 02/23/24 1109 02/24/24 0617 02/25/24 0830 02/26/24 0539  AST 20 18 18 29   ALT 31 21 22 28   ALKPHOS 85 77 81 81  BILITOT 1.4* 1.8* 0.9 0.7  PROT 7.0 6.0* 5.9* 6.9  ALBUMIN 4.4 3.7 3.9 4.0   CBG: No results for input(s): GLUCAP in the last 168 hours.  Discharge time spent: greater than 30 minutes.  Signed: Delon Herald, MD [facility] 02/27/2024

## 2024-02-27 NOTE — Progress Notes (Signed)
 Patient discharge with [employer] [facility] before discharge medication delivered. Given to JAYSON Baller RN who will call [employer] to come and get it.

## 2024-02-27 NOTE — Hospital Course (Addendum)
 38yo with h/o hypothyroidism, MS, TBI, morbid obesity, and PTSD who presented on 9/20 with urinary symptoms.  He was found to have sepsis due to UTI with E coli bacteremia.  He was treated with Ceftriaxone  -> Ciprofloxacin .

## 2024-02-28 LAB — CULTURE, BLOOD (ROUTINE X 2): Culture: NO GROWTH

## 2024-03-01 LAB — CULTURE, BLOOD (ROUTINE X 2)
Culture: NO GROWTH
Special Requests: ADEQUATE

## 2024-05-26 ENCOUNTER — Other Ambulatory Visit (HOSPITAL_COMMUNITY): Payer: Self-pay
